# Patient Record
Sex: Male | Born: 1950 | Race: White | Hispanic: No | Smoking: Never smoker
Health system: Southern US, Community
[De-identification: ages and names within clinical notes are randomized; demographics above are authoritative.]

## PROBLEM LIST (undated history)

## (undated) DIAGNOSIS — I10 Essential (primary) hypertension: Secondary | ICD-10-CM

## (undated) HISTORY — DX: Essential (primary) hypertension: I10

## (undated) HISTORY — PX: CHOLECYSTECTOMY: SHX55

## (undated) HISTORY — PX: COLON SURGERY: SHX602

---

## 2014-11-13 ENCOUNTER — Telehealth: Payer: Self-pay | Admitting: *Deleted

## 2014-11-13 NOTE — Telephone Encounter (Signed)
Patient phoned needing to know Dr. Perfecto Kingdom walk in clinic schedule/appt - having strep throat sxs.  Requested Dr. Everlene Farrier specifically and stated that he was "Dr. Loreli Slot Waldschmidt's brother"....and he also brings his mother to see Dr. Everlene Farrier (did not state her name).....  Scheduled pt for a 15 min OV with Daub for 11/14/14 - tomorrow morning.  Address and contact information updated & confirmed.  No insurance - self pay.  Explained to patient that Dr. Everlene Farrier was not accepting new patients, that I may have to call him back & schedule him with someone else.  He verbalized understanding and appreciation.

## 2014-11-13 NOTE — Telephone Encounter (Signed)
This is the patient I was discussing with you.

## 2014-11-14 ENCOUNTER — Ambulatory Visit (INDEPENDENT_AMBULATORY_CARE_PROVIDER_SITE_OTHER): Payer: Self-pay | Admitting: Emergency Medicine

## 2014-11-14 ENCOUNTER — Encounter: Payer: Self-pay | Admitting: Emergency Medicine

## 2014-11-14 VITALS — BP 130/80 | HR 88 | Temp 98.1°F | Resp 18 | Ht 71.5 in | Wt 270.0 lb

## 2014-11-14 DIAGNOSIS — J029 Acute pharyngitis, unspecified: Secondary | ICD-10-CM

## 2014-11-14 LAB — POCT CBC
Granulocyte percent: 74 %G (ref 37–80)
HEMATOCRIT: 45.4 % (ref 43.5–53.7)
HEMOGLOBIN: 14.8 g/dL (ref 14.1–18.1)
Lymph, poc: 1.9 (ref 0.6–3.4)
MCH, POC: 30.5 pg (ref 27–31.2)
MCHC: 32.6 g/dL (ref 31.8–35.4)
MCV: 93.4 fL (ref 80–97)
MID (cbc): 0.3 (ref 0–0.9)
MPV: 7.1 fL (ref 0–99.8)
POC Granulocyte: 6.3 (ref 2–6.9)
POC LYMPH %: 22.5 % (ref 10–50)
POC MID %: 3.5 % (ref 0–12)
Platelet Count, POC: 195 10*3/uL (ref 142–424)
RBC: 4.85 M/uL (ref 4.69–6.13)
RDW, POC: 12.8 %
WBC: 8.5 10*3/uL (ref 4.6–10.2)

## 2014-11-14 LAB — POCT RAPID STREP A (OFFICE): RAPID STREP A SCREEN: NEGATIVE

## 2014-11-14 MED ORDER — FLUTICASONE PROPIONATE 50 MCG/ACT NA SUSP: 2.0000 | Freq: Every day | NASAL | Status: DC

## 2014-11-14 NOTE — Progress Notes (Addendum)
Subjective:  This chart was scribed for Arlyss Queen, MD by Leandra Kern, Medical Scribe. This patient was seen in Room 21 and the patient's care was started at 10:48 AM.   Patient ID: Brian Mcguire, male    DOB: 1950-07-11, 64 y.o.   MRN: [id number]  HPI HPI Comments: Brian Mcguire is a 64 y.o. male who presents to [facility] complaining of sore throat, gradual onset 5 days ago.  Pt reports associated symptoms of congestion, occasional productive cough of white color, trouble swallowing. He indicated the symptoms have improved since. Today, pt presents with a mild sore throat and cough. He is concerned that he might have a strep throat. Pt notes that he does have some dental issues, and he is concerned that they have complicated and might be related to today's symptoms. Pt does not have any previous allergies at this time of the year. Pt was never a regular smoker.   Pt notes that he is having trouble hearing with his left ear more than his right ear. He indicates that he took some over the counter medication, however he found no relief.   Pt notes that his mother is doing fine. She is comfortable, and is still under Hospice care.    Review of Systems  HENT: Positive for congestion, hearing loss (trouble hearing on Left ear vs. Right), sore throat and trouble swallowing.   Respiratory: Positive for cough.       Objective:   Physical Exam  Constitutional: He is oriented to person, place, and time. He appears well-developed and well-nourished. No distress.  HENT:  Head: Normocephalic and atraumatic.  Throat is slightly red.   Eyes: EOM are normal. Pupils are equal, round, and reactive to light.  Neck: Neck supple.  Cardiovascular: Normal rate and regular rhythm.  Exam reveals no gallop and no friction rub.   No murmur heard. Pulmonary/Chest: Effort normal and breath sounds normal. No respiratory distress. He has no wheezes. He has no rales.  Musculoskeletal:  Normal range of motion.  Neurological: He is alert and oriented to person, place, and time. No cranial nerve deficit.  Skin: Skin is warm and dry.  Psychiatric: He has a normal mood and affect. His behavior is normal.  Nursing note and vitals reviewed.  Results for orders placed or performed in visit on 11/14/14  POCT rapid strep A  Result Value Ref Range   Rapid Strep A Screen Negative Negative  POCT CBC  Result Value Ref Range   WBC 8.5 4.6 - 10.2 K/uL   Lymph, poc 1.9 0.6 - 3.4   POC LYMPH PERCENT 22.5 10 - 50 %L   MID (cbc) 0.3 0 - 0.9   POC MID % 3.5 0 - 12 %M   POC Granulocyte 6.3 2 - 6.9   Granulocyte percent 74.0 37 - 80 %G   RBC 4.85 4.69 - 6.13 M/uL   Hemoglobin 14.8 14.1 - 18.1 g/dL   HCT, POC 45.4 43.5 - 53.7 %   MCV 93.4 80 - 97 fL   MCH, POC 30.5 27 - 31.2 pg   MCHC 32.6 31.8 - 35.4 g/dL   RDW, POC 12.8 %   Platelet Count, POC 195 142 - 424 K/uL   MPV 7.1 0 - 99.8 fL       Assessment & Plan:  This appears to be a viral type illness. I advised him to do symptomatic care. He can do salt water gargles Tylenol  and try Flonase nasal spray.I personally performed the services described in this documentation, which was scribed in my presence. The recorded information has been reviewed and is accurate.  Nena Jordan, MD

## 2014-11-14 NOTE — Patient Instructions (Signed)

## 2014-11-14 NOTE — Telephone Encounter (Signed)
Discussed scheduling OV with Rodney Langton, BSN, who spoke with Dr. Everlene Farrier, and per Dr. Everlene Farrier, this OV is acceptable.

## 2014-11-15 LAB — CULTURE, GROUP A STREP: ORGANISM ID, BACTERIA: NORMAL

## 2015-11-22 DIAGNOSIS — H5213 Myopia, bilateral: Secondary | ICD-10-CM | POA: Diagnosis not present

## 2015-11-22 DIAGNOSIS — H2513 Age-related nuclear cataract, bilateral: Secondary | ICD-10-CM | POA: Diagnosis not present

## 2015-11-22 DIAGNOSIS — H52203 Unspecified astigmatism, bilateral: Secondary | ICD-10-CM | POA: Diagnosis not present

## 2015-11-26 DIAGNOSIS — D123 Benign neoplasm of transverse colon: Secondary | ICD-10-CM | POA: Diagnosis not present

## 2015-11-26 DIAGNOSIS — D124 Benign neoplasm of descending colon: Secondary | ICD-10-CM | POA: Diagnosis not present

## 2015-11-26 DIAGNOSIS — K635 Polyp of colon: Secondary | ICD-10-CM | POA: Diagnosis not present

## 2015-11-26 DIAGNOSIS — K5730 Diverticulosis of large intestine without perforation or abscess without bleeding: Secondary | ICD-10-CM | POA: Diagnosis not present

## 2015-11-26 DIAGNOSIS — Z86010 Personal history of colonic polyps: Secondary | ICD-10-CM | POA: Diagnosis not present

## 2015-11-27 DIAGNOSIS — D239 Other benign neoplasm of skin, unspecified: Secondary | ICD-10-CM | POA: Diagnosis not present

## 2015-11-27 DIAGNOSIS — L570 Actinic keratosis: Secondary | ICD-10-CM | POA: Diagnosis not present

## 2015-11-27 DIAGNOSIS — L821 Other seborrheic keratosis: Secondary | ICD-10-CM | POA: Diagnosis not present

## 2015-12-03 ENCOUNTER — Ambulatory Visit (INDEPENDENT_AMBULATORY_CARE_PROVIDER_SITE_OTHER): Payer: PPO | Admitting: Emergency Medicine

## 2015-12-03 ENCOUNTER — Telehealth: Payer: Self-pay

## 2015-12-03 ENCOUNTER — Ambulatory Visit (INDEPENDENT_AMBULATORY_CARE_PROVIDER_SITE_OTHER): Payer: PPO

## 2015-12-03 VITALS — BP 134/90 | HR 54 | Temp 98.6°F | Resp 17 | Ht 70.5 in | Wt 232.0 lb

## 2015-12-03 DIAGNOSIS — F1920 Other psychoactive substance dependence, uncomplicated: Secondary | ICD-10-CM | POA: Diagnosis not present

## 2015-12-03 DIAGNOSIS — H9313 Tinnitus, bilateral: Secondary | ICD-10-CM

## 2015-12-03 DIAGNOSIS — IMO0001 Reserved for inherently not codable concepts without codable children: Secondary | ICD-10-CM

## 2015-12-03 DIAGNOSIS — I10 Essential (primary) hypertension: Secondary | ICD-10-CM | POA: Diagnosis not present

## 2015-12-03 DIAGNOSIS — G8929 Other chronic pain: Secondary | ICD-10-CM | POA: Diagnosis not present

## 2015-12-03 DIAGNOSIS — Z1159 Encounter for screening for other viral diseases: Secondary | ICD-10-CM

## 2015-12-03 DIAGNOSIS — R030 Elevated blood-pressure reading, without diagnosis of hypertension: Secondary | ICD-10-CM

## 2015-12-03 DIAGNOSIS — Z0000 Encounter for general adult medical examination without abnormal findings: Secondary | ICD-10-CM | POA: Diagnosis not present

## 2015-12-03 DIAGNOSIS — R9431 Abnormal electrocardiogram [ECG] [EKG]: Secondary | ICD-10-CM

## 2015-12-03 DIAGNOSIS — Z125 Encounter for screening for malignant neoplasm of prostate: Secondary | ICD-10-CM

## 2015-12-03 DIAGNOSIS — M25562 Pain in left knee: Secondary | ICD-10-CM

## 2015-12-03 DIAGNOSIS — M25462 Effusion, left knee: Secondary | ICD-10-CM | POA: Diagnosis not present

## 2015-12-03 DIAGNOSIS — Z114 Encounter for screening for human immunodeficiency virus [HIV]: Secondary | ICD-10-CM | POA: Diagnosis not present

## 2015-12-03 DIAGNOSIS — Z23 Encounter for immunization: Secondary | ICD-10-CM | POA: Diagnosis not present

## 2015-12-03 LAB — LIPID PANEL
Cholesterol: 147 mg/dL (ref 125–200)
HDL: 47 mg/dL (ref >=40)
LDL CALC: 84 mg/dL (ref <130)
Total CHOL/HDL Ratio: 3.1 Ratio (ref <=5.0)
Triglycerides: 81 mg/dL (ref <150)
VLDL: 16 mg/dL (ref <30)

## 2015-12-03 LAB — POCT CBC
GRANULOCYTE PERCENT: 65 % (ref 37–80)
HCT, POC: 41.6 % — AB (ref 43.5–53.7)
Hemoglobin: 14.5 g/dL (ref 14.1–18.1)
Lymph, poc: 1.8 (ref 0.6–3.4)
MCH: 33.1 pg — AB (ref 27–31.2)
MCHC: 34.9 g/dL (ref 31.8–35.4)
MCV: 94.8 fL (ref 80–97)
MID (cbc): 0.7 (ref 0–0.9)
MPV: 7 fL (ref 0–99.8)
POC Granulocyte: 4.5 (ref 2–6.9)
POC LYMPH PERCENT: 25.4 %L (ref 10–50)
POC MID %: 9.6 % (ref 0–12)
Platelet Count, POC: 170 10*3/uL (ref 142–424)
RBC: 4.39 M/uL — AB (ref 4.69–6.13)
RDW, POC: 13 %
WBC: 6.9 10*3/uL (ref 4.6–10.2)

## 2015-12-03 LAB — POCT URINALYSIS DIP (MANUAL ENTRY)
BILIRUBIN UA: NEGATIVE
Glucose, UA: NEGATIVE
Leukocytes, UA: NEGATIVE
Nitrite, UA: NEGATIVE
PH UA: 6
Protein Ur, POC: 30 — AB
RBC UA: NEGATIVE
SPEC GRAV UA: 1.02
Urobilinogen, UA: 1

## 2015-12-03 LAB — COMPLETE METABOLIC PANEL WITH GFR
ALBUMIN: 4.7 g/dL (ref 3.6–5.1)
ALK PHOS: 59 U/L (ref 40–115)
ALT: 15 U/L (ref 9–46)
AST: 17 U/L (ref 10–35)
BUN: 13 mg/dL (ref 7–25)
CO2: 29 mmol/L (ref 20–31)
CREATININE: 1.1 mg/dL (ref 0.70–1.25)
Calcium: 9.5 mg/dL (ref 8.6–10.3)
Chloride: 98 mmol/L (ref 98–110)
GFR, EST NON AFRICAN AMERICAN: 70 mL/min (ref >=60)
GFR, Est African American: 81 mL/min (ref >=60)
Glucose, Bld: 93 mg/dL (ref 65–99)
Potassium: 4.3 mmol/L (ref 3.5–5.3)
SODIUM: 137 mmol/L (ref 135–146)
TOTAL PROTEIN: 6.9 g/dL (ref 6.1–8.1)
Total Bilirubin: 1.2 mg/dL (ref 0.2–1.2)

## 2015-12-03 LAB — HEPATITIS C ANTIBODY: HCV Ab: NEGATIVE

## 2015-12-03 LAB — HIV ANTIBODY (ROUTINE TESTING W REFLEX): HIV 1&2 Ab, 4th Generation: NONREACTIVE

## 2015-12-03 LAB — POC MICROSCOPIC URINALYSIS (UMFC): Mucus: ABSENT

## 2015-12-03 MED ORDER — ZOSTER VACCINE LIVE 19400 UNT/0.65ML ~~LOC~~ SUSR: 0.6500 mL | Freq: Once | SUBCUTANEOUS | 0 refills | Status: AC

## 2015-12-03 NOTE — Patient Instructions (Addendum)
IF you received an x-ray today, you will receive an invoice from [facility]. Please contact [facility] at [phone] with questions or concerns regarding your invoice.   IF you received labwork today, you will receive an invoice from [employer]. Please contact [facility] at [phone] with questions or concerns regarding your invoice.   Our billing staff will not be able to assist you with questions regarding bills from these companies.  You will be contacted with the lab results as soon as they are available. The fastest way to get your results is to activate your My Chart account. Instructions are located on the last page of this paperwork. If you have not heard from [place] regarding the results in 2 weeks, please contact this office.     Routine gHealth Maintenance, Male A healthy lifestyle and preventative care can promote health and wellness.  Maintain regular health, dental, and eye exams.  Eat a healthy diet. Foods like vegetables, fruits, whole grains, low-fat dairy products, and lean protein foods contain the nutrients you need and are low in calories. Decrease your intake of foods high in solid fats, added sugars, and salt. Get information about a proper diet from your health care provider, if necessary.  Regular physical exercise is one of the most important things you can do for your health. Most adults should get at least 150 minutes of moderate-intensity exercise (any activity that increases your heart rate and causes you to sweat) each week. In addition, most adults need muscle-strengthening exercises on 2 or more days a week.   Maintain a healthy weight. The body mass index (BMI) is a screening tool to identify possible weight problems. It provides an estimate of body fat based on height and weight. Your health care provider can find your BMI and can help you achieve or maintain a healthy weight. For males 20 years and  older:  A BMI below 18.5 is considered underweight.  A BMI of 18.5 to 24.9 is normal.  A BMI of 25 to 29.9 is considered overweight.  A BMI of 30 and above is considered obese.  Maintain normal blood lipids and cholesterol by exercising and minimizing your intake of saturated fat. Eat a balanced diet with plenty of fruits and vegetables. Blood tests for lipids and cholesterol should begin at age 96 and be repeated every 5 years. If your lipid or cholesterol levels are high, you are over age 39, or you are at high risk for heart disease, you may need your cholesterol levels checked more frequently.Ongoing high lipid and cholesterol levels should be treated with medicines if diet and exercise are not working.  If you smoke, find out from your health care provider how to quit. If you do not use tobacco, do not start.  Lung cancer screening is recommended for adults aged 42-80 years who are at high risk for developing lung cancer because of a history of smoking. A yearly low-dose CT scan of the lungs is recommended for people who have at least a 30-pack-year history of smoking and are current smokers or have quit within the past 15 years. A pack year of smoking is smoking an average of 1 pack of cigarettes a day for 1 year (for example, a 30-pack-year history of smoking could mean smoking 1 pack a day for 30 years or 2 packs a day for 15 years). Yearly screening should continue until the smoker has stopped smoking for at least 15 years. Yearly screening should  be stopped for people who develop a health problem that would prevent them from having lung cancer treatment.  If you choose to drink alcohol, do not have more than 2 drinks per day. One drink is considered to be 12 oz (360 mL) of beer, 5 oz (150 mL) of wine, or 1.5 oz (45 mL) of liquor.  Avoid the use of street drugs. Do not share needles with anyone. Ask for help if you need support or instructions about stopping the use of drugs.  High  blood pressure causes heart disease and increases the risk of stroke. High blood pressure is more likely to develop in:  People who have blood pressure in the end of the normal range (100-139/85-89 mm Hg).  People who are overweight or obese.  People who are African American.  If you are 60-77 years of age, have your blood pressure checked every 3-5 years. If you are 15 years of age or older, have your blood pressure checked every year. You should have your blood pressure measured twice--once when you are at a hospital or clinic, and once when you are not at a hospital or clinic. Record the average of the two measurements. To check your blood pressure when you are not at a hospital or clinic, you can use:  An automated blood pressure machine at a pharmacy.  A home blood pressure monitor.  If you are 33-44 years old, ask your health care provider if you should take aspirin to prevent heart disease.  Diabetes screening involves taking a blood sample to check your fasting blood sugar level. This should be done once every 3 years after age 83 if you are at a normal weight and without risk factors for diabetes. Testing should be considered at a younger age or be carried out more frequently if you are overweight and have at least 1 risk factor for diabetes.  Colorectal cancer can be detected and often prevented. Most routine colorectal cancer screening begins at the age of 110 and continues through age 73. However, your health care provider may recommend screening at an earlier age if you have risk factors for colon cancer. On a yearly basis, your health care provider may provide home test kits to check for hidden blood in the stool. A small camera at the end of a tube may be used to directly examine the colon (sigmoidoscopy or colonoscopy) to detect the earliest forms of colorectal cancer. Talk to your health care provider about this at age 16 when routine screening begins. A direct exam of the colon  should be repeated every 5-10 years through age 33, unless early forms of precancerous polyps or small growths are found.  People who are at an increased risk for hepatitis B should be screened for this virus. You are considered at high risk for hepatitis B if:  You were born in a country where hepatitis B occurs often. Talk with your health care provider about which countries are considered high risk.  Your parents were born in a high-risk country and you have not received a shot to protect against hepatitis B (hepatitis B vaccine).  You have HIV or AIDS.  You use needles to inject street drugs.  You live with, or have sex with, someone who has hepatitis B.  You are a man who has sex with other men (MSM).  You get hemodialysis treatment.  You take certain medicines for conditions like cancer, organ transplantation, and autoimmune conditions.  Hepatitis C blood testing is  recommended for all people born from [birth year] through 1965 and any individual with known risk factors for hepatitis C.  Healthy men should no longer receive prostate-specific antigen (PSA) blood tests as part of routine cancer screening. Talk to your health care provider about prostate cancer screening.  Testicular cancer screening is not recommended for adolescents or adult males who have no symptoms. Screening includes self-exam, a health care provider exam, and other screening tests. Consult with your health care provider about any symptoms you have or any concerns you have about testicular cancer.  Practice safe sex. Use condoms and avoid high-risk sexual practices to reduce the spread of sexually transmitted infections (STIs).  You should be screened for STIs, including gonorrhea and chlamydia if:  You are sexually active and are younger than 24 years.  You are older than 24 years, and your health care provider tells you that you are at risk for this type of infection.  Your sexual activity has changed since you  were last screened, and you are at an increased risk for chlamydia or gonorrhea. Ask your health care provider if you are at risk.  If you are at risk of being infected with HIV, it is recommended that you take a prescription medicine daily to prevent HIV infection. This is called pre-exposure prophylaxis (PrEP). You are considered at risk if:  You are a man who has sex with other men (MSM).  You are a heterosexual man who is sexually active with multiple partners.  You take drugs by injection.  You are sexually active with a partner who has HIV.  Talk with your health care provider about whether you are at high risk of being infected with HIV. If you choose to begin PrEP, you should first be tested for HIV. You should then be tested every 3 months for as long as you are taking PrEP.  Use sunscreen. Apply sunscreen liberally and repeatedly throughout the day. You should seek shade when your shadow is shorter than you. Protect yourself by wearing long sleeves, pants, a wide-brimmed hat, and sunglasses year round whenever you are outdoors.  Tell your health care provider of new moles or changes in moles, especially if there is a change in shape or color. Also, tell your health care provider if a mole is larger than the size of a pencil eraser.  A one-time screening for abdominal aortic aneurysm (AAA) and surgical repair of large AAAs by ultrasound is recommended for men aged 31-75 years who are current or former smokers.  Stay current with your vaccines (immunizations).   This information is not intended to replace advice given to you by your health care provider. Make sure you discuss any questions you have with your health care provider.   Document Released: 10/18/2007 Document Revised: 05/12/2014 Document Reviewed: 09/16/2010 Elsevier Interactive Patient Education [employer].
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
## 2015-12-03 NOTE — Telephone Encounter (Signed)
Pt states he requested Medical records: Xray disc- knee and chest but only received imaging for the Chest Xray. If possible, please make a copy W/view of knee, as he would like to come by this evening to pick up his results before the close of business.   **this msg will be marked as urgent due to item being req. @ the time of service

## 2015-12-04 ENCOUNTER — Telehealth: Payer: Self-pay | Admitting: Emergency Medicine

## 2015-12-04 LAB — PSA, MEDICARE: PSA: 0.28 ng/mL (ref <=4.00)

## 2015-12-04 NOTE — Telephone Encounter (Signed)
Please call patient and let him know his labs were excellent.

## 2015-12-04 NOTE — Telephone Encounter (Signed)
Sent to xray.

## 2015-12-04 NOTE — Telephone Encounter (Signed)
Pt given lab results Pt wanted to know if he should see an Audiologist for tinnitus that he discussed during yesterdays visit. Please f/u

## 2015-12-05 ENCOUNTER — Other Ambulatory Visit: Payer: Self-pay | Admitting: Emergency Medicine

## 2015-12-05 DIAGNOSIS — H9313 Tinnitus, bilateral: Secondary | ICD-10-CM

## 2015-12-05 NOTE — Telephone Encounter (Signed)
Call patient and let him know I made referral to ENT.

## 2015-12-05 NOTE — Telephone Encounter (Signed)
Left VM informing pt of referral and if he has any questions to call back.

## 2015-12-13 DIAGNOSIS — H9313 Tinnitus, bilateral: Secondary | ICD-10-CM | POA: Diagnosis not present

## 2015-12-13 DIAGNOSIS — H903 Sensorineural hearing loss, bilateral: Secondary | ICD-10-CM | POA: Diagnosis not present

## 2015-12-13 DIAGNOSIS — H6121 Impacted cerumen, right ear: Secondary | ICD-10-CM | POA: Diagnosis not present

## 2016-08-02 ENCOUNTER — Ambulatory Visit (INDEPENDENT_AMBULATORY_CARE_PROVIDER_SITE_OTHER): Payer: PPO

## 2016-08-02 ENCOUNTER — Ambulatory Visit (INDEPENDENT_AMBULATORY_CARE_PROVIDER_SITE_OTHER): Payer: PPO | Admitting: Physician Assistant

## 2016-08-02 ENCOUNTER — Telehealth: Payer: Self-pay

## 2016-08-02 VITALS — BP 148/90 | HR 76 | Temp 97.5°F | Resp 16 | Ht 71.0 in | Wt 238.1 lb

## 2016-08-02 DIAGNOSIS — R053 Chronic cough: Secondary | ICD-10-CM

## 2016-08-02 DIAGNOSIS — R030 Elevated blood-pressure reading, without diagnosis of hypertension: Secondary | ICD-10-CM | POA: Diagnosis not present

## 2016-08-02 DIAGNOSIS — R05 Cough: Secondary | ICD-10-CM | POA: Diagnosis not present

## 2016-08-02 LAB — POCT CBC
Granulocyte percent: 68.6 %G (ref 37–80)
HCT, POC: 41.8 % — AB (ref 43.5–53.7)
HEMOGLOBIN: 14.9 g/dL (ref 14.1–18.1)
LYMPH, POC: 1.5 (ref 0.6–3.4)
MCH, POC: 34.4 pg — AB (ref 27–31.2)
MCHC: 35.6 g/dL — AB (ref 31.8–35.4)
MCV: 96.8 fL (ref 80–97)
MID (cbc): 0.5 (ref 0–0.9)
MPV: 7.5 fL (ref 0–99.8)
POC Granulocyte: 4.5 (ref 2–6.9)
POC LYMPH PERCENT: 23.8 %L (ref 10–50)
POC MID %: 7.6 % (ref 0–12)
Platelet Count, POC: 146 10*3/uL (ref 142–424)
RBC: 4.32 M/uL — AB (ref 4.69–6.13)
RDW, POC: 13.8 %
WBC: 6.5 10*3/uL (ref 4.6–10.2)

## 2016-08-02 MED ORDER — AZITHROMYCIN 250 MG PO TABS: ORAL_TABLET | ORAL | 0 refills | Status: DC

## 2016-08-02 NOTE — Telephone Encounter (Signed)
He needs an OV. Please schedule. Philis Fendt, MS, PA-C 1:18 PM, 08/02/2016

## 2016-08-02 NOTE — Telephone Encounter (Signed)
Pt forgot to ask you about getting an rx for viagra today, can you rx this or does he need an ov?

## 2016-08-02 NOTE — Patient Instructions (Signed)
     IF you received an x-ray today, you will receive an invoice from [facility]. Please contact [facility] at [phone] with questions or concerns regarding your invoice.   IF you received labwork today, you will receive an invoice from [facility]. Please contact [facility] at [phone] with questions or concerns regarding your invoice.   Our billing staff will not be able to assist you with questions regarding bills from these companies.  You will be contacted with the lab results as soon as they are available. The fastest way to get your results is to activate your My Chart account. Instructions are located on the last page of this paperwork. If you have not heard from us regarding the results in 2 weeks, please contact this office.     

## 2016-08-02 NOTE — Progress Notes (Addendum)
08/02/2016 10:45 AM   DOB: [birth year] / MRN: [id number]  SUBJECTIVE:  Brian Mcguire is a 66 y.o. male presenting for that cough three weeks ago.  Thought this was the flu. The cough was fading after about 2 weeks then came back and was worse.  No history of asthma.  Never smoker.Associates nasal congestion early in the course of the illness however this has largely resolved.    He has No Known Allergies.   He  has no past medical history on file.    He  reports that he has never smoked. He has never used smokeless tobacco. He reports that he does not drink alcohol or use drugs. He  reports that he does not engage in sexual activity. The patient  has a past surgical history that includes Colon surgery and Cholecystectomy.  His family history includes Cancer in his father; Heart disease in his father and maternal grandfather; Hypertension in his father.  Review of Systems  Constitutional: Negative for diaphoresis.  Respiratory: Negative for cough, hemoptysis, sputum production, shortness of breath and wheezing.   Cardiovascular: Negative for chest pain, orthopnea and leg swelling.  Gastrointestinal: Negative for nausea.  Neurological: Negative for dizziness.    The problem list and medications were reviewed and updated by myself where necessary and exist elsewhere in the encounter.   OBJECTIVE:  BP (!) 148/90 (BP Location: Right Arm, Patient Position: Sitting, Cuff Size: Large)   Pulse 76   Temp 97.5 F (36.4 C) (Oral)   Resp 16   Ht 5\' 11"  (1.803 m)   Wt 238 lb 2 oz (108 kg)   SpO2 99%   BMI 33.21 kg/m   BP Readings from Last 3 Encounters:  08/02/16 (!) 148/90  12/03/15 134/90  11/14/14 130/80    Physical Exam  Constitutional: He is oriented to person, place, and time. He appears well-developed and well-nourished. No distress.  Cardiovascular: Normal rate and regular rhythm.   Pulmonary/Chest: Effort normal and breath sounds normal. No stridor. No respiratory distress.  He has no wheezes. He has no rales.  Abdominal: Soft.  Neurological: He is alert and oriented to person, place, and time.  Skin: Skin is warm and dry. He is not diaphoretic.    Results for orders placed or performed in visit on 08/02/16 (from the past 72 hour(s))  POCT CBC     Status: Abnormal   Collection Time: 08/02/16 10:30 AM  Result Value Ref Range   WBC 6.5 4.6 - 10.2 K/uL   Lymph, poc 1.5 0.6 - 3.4   POC LYMPH PERCENT 23.8 10 - 50 %L   MID (cbc) 0.5 0 - 0.9   POC MID % 7.6 0 - 12 %M   POC Granulocyte 4.5 2 - 6.9   Granulocyte percent 68.6 37 - 80 %G   RBC 4.32 (A) 4.69 - 6.13 M/uL   Hemoglobin 14.9 14.1 - 18.1 g/dL   HCT, POC 41.8 (A) 43.5 - 53.7 %   MCV 96.8 80 - 97 fL   MCH, POC 34.4 (A) 27 - 31.2 pg   MCHC 35.6 (A) 31.8 - 35.4 g/dL   RDW, POC 13.8 %   Platelet Count, POC 146 142 - 424 K/uL   MPV 7.5 0 - 99.8 fL    Dg Chest 2 View  Result Date: 08/02/2016 CLINICAL DATA:  Cough for 3 weeks EXAM: CHEST  2 VIEW COMPARISON:  December 03, 2015 FINDINGS: No pneumothorax. The heart size borderline but stable. The hila and  mediastinum are normal. Minimal scar or chronic atelectasis in the left base. No other acute abnormalities. No focal infiltrate. IMPRESSION: No active cardiopulmonary disease. Electronically Signed   By: Dorise Bullion III M.D   On: 08/02/2016 10:38    ASSESSMENT AND PLAN:  Brian Mcguire was seen today for cough, wheezing and nasal congestion.  Diagnoses and all orders for this visit:  Persistent cough for 3 weeks or longer: Likely post viral cough.  Will give him more time.  He can try zpac in about 7 days if not improving. Of note BP up today higher than usual for him.  He will monitor and come back if pressures remain greater than 140/90.   -     DG Chest 2 View; Future -     POCT CBC    The patient is advised to call or return to clinic if he does not see an improvement in symptoms, or to seek the care of the closest emergency department if he worsens with  the above plan.   Philis Fendt, MHS, PA-C [facility] 08/02/2016 10:45 AM

## 2016-08-06 ENCOUNTER — Ambulatory Visit (INDEPENDENT_AMBULATORY_CARE_PROVIDER_SITE_OTHER): Payer: PPO | Admitting: Physician Assistant

## 2016-08-06 VITALS — BP 183/106 | HR 76 | Temp 98.7°F | Resp 18 | Ht 71.0 in | Wt 241.2 lb

## 2016-08-06 DIAGNOSIS — I10 Essential (primary) hypertension: Secondary | ICD-10-CM | POA: Diagnosis not present

## 2016-08-06 DIAGNOSIS — N529 Male erectile dysfunction, unspecified: Secondary | ICD-10-CM

## 2016-08-06 MED ORDER — SILDENAFIL CITRATE 100 MG PO TABS: ORAL_TABLET | ORAL | 11 refills | Status: DC

## 2016-08-06 MED ORDER — SILDENAFIL CITRATE 100 MG PO TABS: ORAL_TABLET | ORAL | 3 refills | Status: DC

## 2016-08-06 MED ORDER — LISINOPRIL-HYDROCHLOROTHIAZIDE 10-12.5 MG PO TABS: 1.0000 | ORAL_TABLET | Freq: Every day | ORAL | 1 refills | Status: DC

## 2016-08-06 NOTE — Progress Notes (Signed)
08/06/2016 4:08 PM   DOB: 1951/03/06 / MRN: [id number]  SUBJECTIVE:  Brian Mcguire is a 66 y.o. male presenting for Viagra.  He has taken this before from a friend and he tells me it helped him get an erection faster, but more importantly it helped him maintain an erection. He took 25 mg.  Tells me he struggles with maintaining an erection about 50% of the time and often has to cease intercourse to "try and rally later."  He has no known heart problems and does not take nitrates.    He has No Known Allergies.   He  has no past medical history on file.    He  reports that he has never smoked. He has never used smokeless tobacco. He reports that he does not drink alcohol or use drugs. He  reports that he does not engage in sexual activity. The patient  has a past surgical history that includes Colon surgery and Cholecystectomy.  His family history includes Cancer in his father; Heart disease in his father and maternal grandfather; Hypertension in his father.  Review of Systems  Constitutional: Negative for diaphoresis.  Respiratory: Negative for cough, hemoptysis, sputum production, shortness of breath and wheezing.   Cardiovascular: Negative for chest pain, orthopnea and leg swelling.  Gastrointestinal: Negative for abdominal pain, blood in stool, constipation, diarrhea, heartburn, melena, nausea and vomiting.  Genitourinary: Negative for flank pain.  Neurological: Negative for dizziness.    The problem list and medications were reviewed and updated by myself where necessary and exist elsewhere in the encounter.   OBJECTIVE:  BP (!) 183/106 (BP Location: Left Arm, Patient Position: Sitting, Cuff Size: Large)   Pulse 76   Temp 98.7 F (37.1 C) (Oral)   Resp 18   Ht 5\' 11"  (1.803 m)   Wt 241 lb 3.2 oz (109.4 kg)   SpO2 96%   BMI 33.64 kg/m   Physical Exam  Constitutional: He is oriented to person, place, and time.  Eyes: EOM are normal. Pupils are equal, round, and reactive to  light.  Cardiovascular: Regular rhythm, S1 normal, S2 normal, normal heart sounds, intact distal pulses and normal pulses.  Exam reveals no gallop and no friction rub.   No murmur heard. Pulmonary/Chest: Effort normal. No stridor. No respiratory distress. He has no wheezes. He has no rales.  Abdominal: He exhibits no distension.  Musculoskeletal: He exhibits no edema.  Neurological: He is alert and oriented to person, place, and time. He has normal strength. He is not disoriented. No cranial nerve deficit or sensory deficit. He exhibits normal muscle tone. Coordination and gait normal.  Psychiatric: His behavior is normal.    No results found for: TSH  BP Readings from Last 3 Encounters:  08/06/16 (!) 183/106  08/02/16 (!) 148/90  12/03/15 134/90   No results found for this or any previous visit (from the past 72 hour(s)).  No results found.  ASSESSMENT AND PLAN:  Billyjoe was seen today for medication refill.  Diagnoses and all orders for this visit:  Erectile dysfunction, unspecified erectile dysfunction type:  He has persistent difficulty with maintaining an erection. Given problem two and that he is starting a BP agent I have advised he hold this for about 5 days before starting.   -     sildenafil (VIAGRA) 100 MG tablet; Take 1/4 tab daily as needed.  Asymptomatic hypertension: He is asymptomatic today.  Will go ahead and start him on a dual agent and he will  check his BP at home.  If persistently greater that 140/90 he will return to clinic, otherwise I will see him back in three months.  -     lisinopril-hydrochlorothiazide (PRINZIDE,ZESTORETIC) 10-12.5 MG tablet; Take 1 tablet by mouth daily. -     Basic metabolic panel -     TSH     The patient is advised to call or return to clinic if he does not see an improvement in symptoms, or to seek the care of the closest emergency department if he worsens with the above plan.   Philis Fendt, MHS, PA-C [facility] 08/06/2016 4:08 PM

## 2016-08-06 NOTE — Patient Instructions (Addendum)
Please check blood pressure.  If blood pressure is 140/90 or greater please return  To our office in two weeks.     IF you received an x-ray today, you will receive an invoice from [facility]. Please contact [facility] at [phone] with questions or concerns regarding your invoice.   IF you received labwork today, you will receive an invoice from [facility]. Please contact [facility] at [phone] with questions or concerns regarding your invoice.   Our billing staff will not be able to assist you with questions regarding bills from these companies.  You will be contacted with the lab results as soon as they are available. The fastest way to get your results is to activate your My Chart account. Instructions are located on the last page of this paperwork. If you have not heard from [place] regarding the results in 2 weeks, please contact this office.

## 2016-08-07 LAB — BASIC METABOLIC PANEL
BUN / CREAT RATIO: 15 (ref 10–24)
BUN: 16 mg/dL (ref 8–27)
CHLORIDE: 96 mmol/L (ref 96–106)
CO2: 26 mmol/L (ref 18–29)
Calcium: 9.1 mg/dL (ref 8.6–10.2)
Creatinine, Ser: 1.06 mg/dL (ref 0.76–1.27)
GFR calc non Af Amer: 73 mL/min/{1.73_m2} (ref >59)
GFR, EST AFRICAN AMERICAN: 85 mL/min/{1.73_m2} (ref >59)
Glucose: 91 mg/dL (ref 65–99)
POTASSIUM: 4.3 mmol/L (ref 3.5–5.2)
Sodium: 138 mmol/L (ref 134–144)

## 2016-11-24 DIAGNOSIS — H2513 Age-related nuclear cataract, bilateral: Secondary | ICD-10-CM | POA: Diagnosis not present

## 2016-12-29 DIAGNOSIS — L570 Actinic keratosis: Secondary | ICD-10-CM | POA: Diagnosis not present

## 2016-12-29 DIAGNOSIS — D229 Melanocytic nevi, unspecified: Secondary | ICD-10-CM | POA: Diagnosis not present

## 2016-12-29 DIAGNOSIS — L820 Inflamed seborrheic keratosis: Secondary | ICD-10-CM | POA: Diagnosis not present

## 2016-12-29 DIAGNOSIS — L720 Epidermal cyst: Secondary | ICD-10-CM | POA: Diagnosis not present

## 2016-12-29 DIAGNOSIS — D492 Neoplasm of unspecified behavior of bone, soft tissue, and skin: Secondary | ICD-10-CM | POA: Diagnosis not present

## 2017-01-26 ENCOUNTER — Other Ambulatory Visit: Payer: Self-pay | Admitting: Physician Assistant

## 2017-01-26 DIAGNOSIS — I10 Essential (primary) hypertension: Secondary | ICD-10-CM

## 2017-03-04 ENCOUNTER — Other Ambulatory Visit: Payer: Self-pay | Admitting: Physician Assistant

## 2017-03-04 DIAGNOSIS — I10 Essential (primary) hypertension: Secondary | ICD-10-CM

## 2017-03-05 ENCOUNTER — Other Ambulatory Visit: Payer: Self-pay | Admitting: Physician Assistant

## 2017-03-05 DIAGNOSIS — I10 Essential (primary) hypertension: Secondary | ICD-10-CM

## 2017-03-05 NOTE — Telephone Encounter (Signed)
Pt. Called looking for a refill of lisinopril for his high blood pressure. He states that he is out of town and is wanting a refill for about 10 days so he can get back in to town and make an appointment to come in and see Clark.   When I asked what pharmacy to send the RX to, pt. States that we would send it to the normal pharmacy and he would have his brother pick it up and send it to him.

## 2017-03-06 MED ORDER — LISINOPRIL-HYDROCHLOROTHIAZIDE 10-12.5 MG PO TABS: 1.0000 | ORAL_TABLET | Freq: Every day | ORAL | 0 refills | Status: DC

## 2017-03-06 NOTE — Telephone Encounter (Signed)
Per chart, patient and pharmacy made aware 9/ 2018 that an office visit is needed before refills would be given. Patient has not scheduled any appointment with office. 15 days only sent in to pharmacy until patient can be seen. No further refills will be given./ S.Kattia Selley,CMA

## 2017-03-23 ENCOUNTER — Ambulatory Visit (INDEPENDENT_AMBULATORY_CARE_PROVIDER_SITE_OTHER): Payer: PPO

## 2017-03-23 ENCOUNTER — Ambulatory Visit: Payer: PPO | Admitting: Family Medicine

## 2017-03-23 VITALS — BP 128/80 | HR 73 | Temp 97.4°F | Ht 71.0 in | Wt 245.5 lb

## 2017-03-23 DIAGNOSIS — Z0000 Encounter for general adult medical examination without abnormal findings: Secondary | ICD-10-CM | POA: Diagnosis not present

## 2017-03-23 NOTE — Progress Notes (Signed)
Subjective:   Brian Mcguire is a 66 y.o. male who presents for Medicare Annual/Subsequent preventive examination.  Review of Systems:  N/A Cardiac Risk Factors include: male gender;sedentary lifestyle;advanced age (>53men, >42 women)     Objective:    Vitals: BP 128/80   Pulse 73   Temp (!) 97.4 F (36.3 C) (Oral)   Ht 5\' 11"  (1.803 m)   Wt 245 lb 8 oz (111.4 kg)   SpO2 98%   BMI 34.24 kg/m   Body mass index is 34.24 kg/m.  Tobacco Social History   Tobacco Use  Smoking Status Never Smoker  Smokeless Tobacco Never Used     Counseling given: Not Answered   History reviewed. No pertinent past medical history. Past Surgical History:  Procedure Laterality Date  . CHOLECYSTECTOMY    . COLON SURGERY     Family History  Problem Relation Age of Onset  . Cancer Father   . Hypertension Father   . Heart disease Father   . Heart disease Maternal Grandfather    Social History   Substance and Sexual Activity  Sexual Activity No    Outpatient Encounter Medications as of 03/23/2017  Medication Sig  . lisinopril-hydrochlorothiazide (PRINZIDE,ZESTORETIC) 10-12.5 MG tablet Take 1 tablet by mouth daily. **NO FURTHER REFILLS WILL BE GIVEN**  . [DISCONTINUED] sildenafil (VIAGRA) 100 MG tablet Take 1/4 tab daily as needed.   No facility-administered encounter medications on file as of 03/23/2017.     Activities of Daily Living In your present state of health, do you have any difficulty performing the following activities: 03/23/2017  Hearing? Y  Comment Patient has tinnitus  Vision? N  Difficulty concentrating or making decisions? N  Walking or climbing stairs? N  Dressing or bathing? N  Doing errands, shopping? N  Preparing Food and eating ? N  Using the Toilet? N  In the past six months, have you accidently leaked urine? N  Do you have problems with loss of bowel control? N  Managing your Medications? N  Managing your Finances? N  Housekeeping or managing your  Housekeeping? N  Some recent data might be hidden    Patient Care Team: Hillis Range as PCP - General (Urgent Care) Rutherford Guys, MD as Consulting Physician (Ophthalmology) Lavonna Monarch, MD as Consulting Physician (Dermatology) Clarene Essex, MD as Consulting Physician (Gastroenterology)   Assessment:     Exercise Activities and Dietary recommendations Current Exercise Habits: The patient does not participate in regular exercise at present, Exercise limited by: None identified  Goals    . Exercise 3x per week (30 min per time)     Patient states that he wants to try to start back exercising on a more regular basis.       Fall Risk Fall Risk  03/23/2017 08/06/2016 08/02/2016 11/14/2014  Falls in the past year? No No No No   Depression Screen PHQ 2/9 Scores 03/23/2017 08/06/2016 08/02/2016 11/14/2014  PHQ - 2 Score 0 0 0 0    Cognitive Function     6CIT Screen 03/23/2017  What Year? 0 points  What month? 0 points  What time? 0 points  Count back from 20 0 points  Months in reverse 0 points  Repeat phrase 2 points  Total Score 2    Immunization History  Administered Date(s) Administered  . Pneumococcal Conjugate-13 12/03/2015   Screening Tests Health Maintenance  Topic Date Due  . INFLUENZA VACCINE  08/16/2017 (Originally 12/03/2016)  . TETANUS/TDAP  03/23/2018 (Originally  08/19/1969)  . PNA vac Low Risk Adult (2 of 2 - PPSV23) 03/23/2018 (Originally 12/02/2016)  . COLONOSCOPY  11/25/2025  . Hepatitis C Screening  Completed      Plan:   I have personally reviewed and noted the following in the patient's chart:   . Medical and social history . Use of alcohol, tobacco or illicit drugs  . Current medications and supplements . Functional ability and status . Nutritional status . Physical activity . Advanced directives . List of other physicians . Hospitalizations, surgeries, and ER visits in previous 12 months . Vitals . Screenings to include cognitive,  depression, and falls . Referrals and appointments  In addition, I have reviewed and discussed with patient certain preventive protocols, quality metrics, and best practice recommendations. A written personalized care plan for preventive services as well as general preventive health recommendations were provided to patient.  Patient declined flu, pneumonia, tetanus and shingle vaccines.  1. Encounter for Medicare annual wellness exam    Andrez Grime, LPN  16/02/9603

## 2017-03-23 NOTE — Patient Instructions (Addendum)
Brian Mcguire , Thank you for taking time to come for your Medicare Wellness Visit. I appreciate your ongoing commitment to your health goals. Please review the following plan we discussed and let me know if I can assist you in the future.   Screening recommendations/referrals: Colonoscopy: up to date, next due 11/25/2025 Recommended yearly ophthalmology/optometry visit for glaucoma screening and checkup Recommended yearly dental visit for hygiene and checkup  Vaccinations: Influenza vaccine: declined  Pneumococcal vaccine: declined Tdap vaccine: declined due to insurance Shingles vaccine: declined  Advanced directives: Advance directive discussed with you today. I have provided a copy for you to complete at home and have notarized. Once this is complete please bring a copy in to our office so we can scan it into your chart.   Conditions/risks identified: Try to start back exercising on a more regular basis.   Next appointment: today @ 2:40 pm with Dr. Nolon Rod   Preventive Care 66 Years and Older, Male Preventive care refers to lifestyle choices and visits with your health care provider that can promote health and wellness. What does preventive care include?  A yearly physical exam. This is also called an annual well check.  Dental exams once or twice a year.  Routine eye exams. Ask your health care provider how often you should have your eyes checked.  Personal lifestyle choices, including:  Daily care of your teeth and gums.  Regular physical activity.  Eating a healthy diet.  Avoiding tobacco and drug use.  Limiting alcohol use.  Practicing safe sex.  Taking low doses of aspirin every day.  Taking vitamin and mineral supplements as recommended by your health care provider. What happens during an annual well check? The services and screenings done by your health care provider during your annual well check will depend on your age, overall health, lifestyle risk factors,  and family history of disease. Counseling  Your health care provider may ask you questions about your:  Alcohol use.  Tobacco use.  Drug use.  Emotional well-being.  Home and relationship well-being.  Sexual activity.  Eating habits.  History of falls.  Memory and ability to understand (cognition).  Work and work Statistician. Screening  You may have the following tests or measurements:  Height, weight, and BMI.  Blood pressure.  Lipid and cholesterol levels. These may be checked every 5 years, or more frequently if you are over 38 years old.  Skin check.  Lung cancer screening. You may have this screening every year starting at age 2 if you have a 30-pack-year history of smoking and currently smoke or have quit within the past 15 years.  Fecal occult blood test (FOBT) of the stool. You may have this test every year starting at age 44.  Flexible sigmoidoscopy or colonoscopy. You may have a sigmoidoscopy every 5 years or a colonoscopy every 10 years starting at age 51.  Prostate cancer screening. Recommendations will vary depending on your family history and other risks.  Hepatitis C blood test.  Hepatitis B blood test.  Sexually transmitted disease (STD) testing.  Diabetes screening. This is done by checking your blood sugar (glucose) after you have not eaten for a while (fasting). You may have this done every 1-3 years.  Abdominal aortic aneurysm (AAA) screening. You may need this if you are a current or former smoker.  Osteoporosis. You may be screened starting at age 41 if you are at high risk. Talk with your health care provider about your test results, treatment options,  and if necessary, the need for more tests. Vaccines  Your health care provider may recommend certain vaccines, such as:  Influenza vaccine. This is recommended every year.  Tetanus, diphtheria, and acellular pertussis (Tdap, Td) vaccine. You may need a Td booster every 10  years.  Zoster vaccine. You may need this after age 14.  Pneumococcal 13-valent conjugate (PCV13) vaccine. One dose is recommended after age 1.  Pneumococcal polysaccharide (PPSV23) vaccine. One dose is recommended after age 46. Talk to your health care provider about which screenings and vaccines you need and how often you need them. This information is not intended to replace advice given to you by your health care provider. Make sure you discuss any questions you have with your health care provider. Document Released: 05/18/2015 Document Revised: 01/09/2016 Document Reviewed: 02/20/2015 Elsevier Interactive Patient Education  2017 [place] Prevention in the Home Falls can cause injuries. They can happen to people of all ages. There are many things you can do to make your home safe and to help prevent falls. What can I do on the outside of my home?  Regularly fix the edges of walkways and driveways and fix any cracks.  Remove anything that might make you trip as you walk through a door, such as a raised step or threshold.  Trim any bushes or trees on the path to your home.  Use bright outdoor lighting.  Clear any walking paths of anything that might make someone trip, such as rocks or tools.  Regularly check to see if handrails are loose or broken. Make sure that both sides of any steps have handrails.  Any raised decks and porches should have guardrails on the edges.  Have any leaves, snow, or ice cleared regularly.  Use sand or salt on walking paths during winter.  Clean up any spills in your garage right away. This includes oil or grease spills. What can I do in the bathroom?  Use night lights.  Install grab bars by the toilet and in the tub and shower. Do not use towel bars as grab bars.  Use non-skid mats or decals in the tub or shower.  If you need to sit down in the shower, use a plastic, non-slip stool.  Keep the floor dry. Clean up any water that  spills on the floor as soon as it happens.  Remove soap buildup in the tub or shower regularly.  Attach bath mats securely with double-sided non-slip rug tape.  Do not have throw rugs and other things on the floor that can make you trip. What can I do in the bedroom?  Use night lights.  Make sure that you have a light by your bed that is easy to reach.  Do not use any sheets or blankets that are too big for your bed. They should not hang down onto the floor.  Have a firm chair that has side arms. You can use this for support while you get dressed.  Do not have throw rugs and other things on the floor that can make you trip. What can I do in the kitchen?  Clean up any spills right away.  Avoid walking on wet floors.  Keep items that you use a lot in easy-to-reach places.  If you need to reach something above you, use a strong step stool that has a grab bar.  Keep electrical cords out of the way.  Do not use floor polish or wax that makes floors slippery. If  you must use wax, use non-skid floor wax.  Do not have throw rugs and other things on the floor that can make you trip. What can I do with my stairs?  Do not leave any items on the stairs.  Make sure that there are handrails on both sides of the stairs and use them. Fix handrails that are broken or loose. Make sure that handrails are as long as the stairways.  Check any carpeting to make sure that it is firmly attached to the stairs. Fix any carpet that is loose or worn.  Avoid having throw rugs at the top or bottom of the stairs. If you do have throw rugs, attach them to the floor with carpet tape.  Make sure that you have a light switch at the top of the stairs and the bottom of the stairs. If you do not have them, ask someone to add them for you. What else can I do to help prevent falls?  Wear shoes that:  Do not have high heels.  Have rubber bottoms.  Are comfortable and fit you well.  Are closed at the  toe. Do not wear sandals.  If you use a stepladder:  Make sure that it is fully opened. Do not climb a closed stepladder.  Make sure that both sides of the stepladder are locked into place.  Ask someone to hold it for you, if possible.  Clearly mark and make sure that you can see:  Any grab bars or handrails.  First and last steps.  Where the edge of each step is.  Use tools that help you move around (mobility aids) if they are needed. These include:  Canes.  Walkers.  Scooters.  Crutches.  Turn on the lights when you go into a dark area. Replace any light bulbs as soon as they burn out.  Set up your furniture so you have a clear path. Avoid moving your furniture around.  If any of your floors are uneven, fix them.  If there are any pets around you, be aware of where they are.  Review your medicines with your doctor. Some medicines can make you feel dizzy. This can increase your chance of falling. Ask your doctor what other things that you can do to help prevent falls. This information is not intended to replace advice given to you by your health care provider. Make sure you discuss any questions you have with your health care provider. Document Released: 02/15/2009 Document Revised: 09/27/2015 Document Reviewed: 05/26/2014 Elsevier Interactive Patient Education  2017 [employer].

## 2017-03-24 ENCOUNTER — Ambulatory Visit: Payer: PPO | Admitting: Family Medicine

## 2017-03-27 ENCOUNTER — Encounter: Payer: Self-pay | Admitting: Family Medicine

## 2017-03-27 ENCOUNTER — Other Ambulatory Visit: Payer: Self-pay

## 2017-03-27 ENCOUNTER — Ambulatory Visit: Payer: PPO | Admitting: Family Medicine

## 2017-03-27 VITALS — BP 138/82 | HR 65 | Temp 98.6°F | Resp 16 | Ht 70.67 in | Wt 249.0 lb

## 2017-03-27 DIAGNOSIS — N529 Male erectile dysfunction, unspecified: Secondary | ICD-10-CM

## 2017-03-27 DIAGNOSIS — I10 Essential (primary) hypertension: Secondary | ICD-10-CM | POA: Diagnosis not present

## 2017-03-27 DIAGNOSIS — R9431 Abnormal electrocardiogram [ECG] [EKG]: Secondary | ICD-10-CM

## 2017-03-27 MED ORDER — LISINOPRIL-HYDROCHLOROTHIAZIDE 10-12.5 MG PO TABS: 1.0000 | ORAL_TABLET | Freq: Every day | ORAL | 2 refills | Status: DC

## 2017-03-27 NOTE — Patient Instructions (Addendum)
  Exercise most days per week - low intensity to begin with (walking, swimming low intensity).   Follow up with cardiologist to discuss any workup needed for higher intensity exercise. If cardiology clears you to take Viagra, I can send some in. Let me know.   Try to decrease alcohol to no more than 1-2 total per day, ideally less is better.   No change in meds for now. Follow up in around 6 months for blood pressure.     IF you received an x-ray today, you will receive an invoice from [facility]. Please contact [facility] at [phone] with questions or concerns regarding your invoice.   IF you received labwork today, you will receive an invoice from [place]. Please contact [facility] at [phone] with questions or concerns regarding your invoice.   Our billing staff will not be able to assist you with questions regarding bills from these companies.  You will be contacted with the lab results as soon as they are available. The fastest way to get your results is to activate your My Chart account. Instructions are located on the last page of this paperwork. If you have not heard from [place] regarding the results in 2 weeks, please contact this office.

## 2017-03-27 NOTE — Progress Notes (Signed)
Subjective:  By signing my name below, I, Brian Mcguire, attest that this documentation has been prepared under the direction and in the presence of Brian Ray, MD. Electronically Signed: Moises Mcguire, [place]. 03/27/2017 , 3:20 PM .  Patient was seen in Room 12 .   Patient ID: Brian Mcguire, male    DOB: 1950-11-27, 66 y.o.   MRN: [id number] Chief Complaint  Patient presents with  . Hypertension    follow-up  . Erectile Dysfunction   HPI Brian Mcguire is a 66 y.o. male Here for follow up of HTN.   HTN He takes Lisinopril-HCTZ 10-12.5mg  QD. He was referred to cardiology for borderline abnormal EKG in 2017 with physical with Dr. Everlene Farrier. There were some concern for alcohol overuse at that time; plan to cut back.   Patient states he ran out of his medication about 8 days ago (last Saturday). He hasn't checked his BP outside of the office regularly. He denies any known side effects with his medications. He denies lightheadedness or dizziness.   Patient mentions drinking a fair amount of black coffee. He's cut out eating junk food. He hasn't had regular exercise for a long time now. He occasionally plays golf, and rides cart instead of walking. He denies chest tightness or pressure when playing. He's still drinking alcoholic beverage about 0-6 drinks a day, up to 24 drinks a week. He's cut back amount since last visit with Dr. Everlene Farrier in 2017. He denies having alcohol at home. He also denies addiction or dependency of alcohol.   He was referred to Dr. Einar Mcguire last year after borderline abnormal EKG. Referral was closed on Aug 22nd, unable to contact after multiple attempts. His EKG last year showed poor R-wave progression with some negative precordial T-waves, sinus bradycardia, rate 51.   Health maintenance He received Prevnar last year; plan for pneumovax today.   Erectile Dysfunction He was prescribed sildenafil 100mg  in April this year. He took a couple quarter tablets  intermittently. He denies any side effects.   Patient Active Problem List   Diagnosis Date Noted  . Substance dependency ([place]) 12/03/2015   History reviewed. No pertinent past medical history. Past Surgical History:  Procedure Laterality Date  . CHOLECYSTECTOMY    . COLON SURGERY     No Known Allergies Prior to Admission medications   Medication Sig Start Date End Date Taking? Authorizing Provider  lisinopril-hydrochlorothiazide (PRINZIDE,ZESTORETIC) 10-12.5 MG tablet Take 1 tablet by mouth daily. **NO FURTHER REFILLS WILL BE GIVEN** 03/06/17  Yes Tereasa Coop, PA-C   Social History   Socioeconomic History  . Marital status: Divorced    Spouse name: Not on file  . Number of children: 2  . Years of education: Not on file  . Highest education level: Some college, no degree  Social Needs  . Financial resource strain: Not hard at all  . Food insecurity - worry: Never true  . Food insecurity - inability: Never true  . Transportation needs - medical: No  . Transportation needs - non-medical: No  Occupational History  . Not on file  Tobacco Use  . Smoking status: Never Smoker  . Smokeless tobacco: Never Used  Substance and Sexual Activity  . Alcohol use: Yes    Alcohol/week: 3.0 - 3.6 oz    Types: 5 - 6 Standard drinks or equivalent per week  . Drug use: No  . Sexual activity: No  Other Topics Concern  . Not on file  Social History Narrative  .  Not on file   Review of Systems  Constitutional: Negative for fatigue and unexpected weight change.  Eyes: Negative for visual disturbance.  Respiratory: Negative for cough, chest tightness and shortness of breath.   Cardiovascular: Negative for chest pain, palpitations and leg swelling.  Gastrointestinal: Negative for abdominal pain and Mcguire in stool.  Neurological: Negative for dizziness, light-headedness and headaches.       Objective:   Physical Exam  Constitutional: He is oriented to person, place, and time. He  appears well-developed and well-nourished.  HENT:  Head: Normocephalic and atraumatic.  Eyes: EOM are normal. Pupils are equal, round, and reactive to light.  Neck: No JVD present. Carotid bruit is not present.  Cardiovascular: Normal rate, regular rhythm and normal heart sounds.  No murmur heard. Pulmonary/Chest: Effort normal and breath sounds normal. He has no rales.  Musculoskeletal: He exhibits no edema.  Neurological: He is alert and oriented to person, place, and time.  Skin: Skin is warm and dry.  Psychiatric: He has a normal mood and affect.  Vitals reviewed.   Vitals:   03/27/17 1437  BP: 138/82  Pulse: 65  Resp: 16  Temp: 98.6 F (37 C)  TempSrc: Oral  SpO2: 98%  Weight: 249 lb (112.9 kg)  Height: 5' 10.67" (1.795 m)   EKG: occasional PVC, sinus rhythm rate 71; no apparent significant change from July 2017.     Assessment & Plan:   [name] is a 66 y.o. male Nonspecific abnormal electrocardiogram (ECG) (EKG) - Plan: Ambulatory referral to Cardiology, EKG 12-Lead  -Asymptomatic, but still recommended evaluation with cardiology to make sure there has not been some silent heart disease, as multiple risk factors. EKG appears to be overall stable from previous visit. Referral placed again to Dr. Einar Mcguire.  Asymptomatic hypertension - Plan: lisinopril-hydrochlorothiazide (PRINZIDE,ZESTORETIC) 10-12.5 MG tablet, Ambulatory referral to Cardiology, Basic metabolic panel  - Borderline elevated in office but off medications. He may not need to be on as high of a dose of meds, but overall has been tolerated. Restart same dose of lisinopril HCTZ, RTC precautions. Labs pending  -Discussed decreasing alcohol to no more than 1-2 drinks per day, lower amounts better with history of hypertension.  Erectile dysfunction, unspecified erectile dysfunction type   Side effects discussed (including but not limited to headache/flushing, blue discoloration of vision, possible vascular  steal and risk of cardiac effects if underlying unknown coronary artery disease, and permanent sensorineural hearing loss) for Viagra, but will hold on medications for now. Will discuss further with cardiology if these medications are safe for him. Can prescribe medication if needed without office visit if he has been cleared to do so by cardiology.   Recheck 6 months  Meds ordered this encounter  Medications  . lisinopril-hydrochlorothiazide (PRINZIDE,ZESTORETIC) 10-12.5 MG tablet    Sig: Take 1 tablet by mouth daily.    Dispense:  90 tablet    Refill:  2   Patient Instructions    Exercise most days per week - low intensity to begin with (walking, swimming low intensity).   Follow up with cardiologist to discuss any workup needed for higher intensity exercise. If cardiology clears you to take Viagra, I can send some in. Let me know.   Try to decrease alcohol to no more than 1-2 total per day, ideally less is better.   No change in meds for now. Follow up in around 6 months for Mcguire pressure.     IF you received an x-Mcguire today,  you will receive an invoice from [facility]. Please contact [facility] at [phone] with questions or concerns regarding your invoice.   IF you received labwork today, you will receive an invoice from [place]. Please contact [facility] at [phone] with questions or concerns regarding your invoice.   Our billing staff will not be able to assist you with questions regarding bills from these companies.  You will be contacted with the lab results as soon as they are available. The fastest way to get your results is to activate your My Chart account. Instructions are located on the last page of this paperwork. If you have not heard from [place] regarding the results in 2 weeks, please contact this office.       I personally performed the services described in this documentation, which was scribed in my presence. The recorded information has  been reviewed and considered for accuracy and completeness, addended by me as needed, and agree with information above.  Signed,   Brian Ray, MD [facility].  03/29/17 12:04 PM \

## 2017-03-28 LAB — BASIC METABOLIC PANEL
BUN / CREAT RATIO: 15 (ref 10–24)
BUN: 15 mg/dL (ref 8–27)
CHLORIDE: 99 mmol/L (ref 96–106)
CO2: 26 mmol/L (ref 20–29)
Calcium: 9.5 mg/dL (ref 8.6–10.2)
Creatinine, Ser: 1.03 mg/dL (ref 0.76–1.27)
GFR calc non Af Amer: 75 mL/min/{1.73_m2} (ref >59)
GFR, EST AFRICAN AMERICAN: 87 mL/min/{1.73_m2} (ref >59)
Glucose: 88 mg/dL (ref 65–99)
POTASSIUM: 4.4 mmol/L (ref 3.5–5.2)
SODIUM: 136 mmol/L (ref 134–144)

## 2017-05-07 DIAGNOSIS — I10 Essential (primary) hypertension: Secondary | ICD-10-CM | POA: Diagnosis not present

## 2017-05-07 DIAGNOSIS — Z0189 Encounter for other specified special examinations: Secondary | ICD-10-CM | POA: Diagnosis not present

## 2017-05-07 DIAGNOSIS — R0602 Shortness of breath: Secondary | ICD-10-CM | POA: Diagnosis not present

## 2017-05-18 DIAGNOSIS — R079 Chest pain, unspecified: Secondary | ICD-10-CM | POA: Diagnosis not present

## 2017-05-18 DIAGNOSIS — I10 Essential (primary) hypertension: Secondary | ICD-10-CM | POA: Diagnosis not present

## 2017-05-18 DIAGNOSIS — R0789 Other chest pain: Secondary | ICD-10-CM | POA: Diagnosis not present

## 2017-05-21 DIAGNOSIS — Z0189 Encounter for other specified special examinations: Secondary | ICD-10-CM | POA: Diagnosis not present

## 2017-05-21 DIAGNOSIS — I10 Essential (primary) hypertension: Secondary | ICD-10-CM | POA: Diagnosis not present

## 2017-05-21 DIAGNOSIS — R0602 Shortness of breath: Secondary | ICD-10-CM | POA: Diagnosis not present

## 2017-08-07 ENCOUNTER — Telehealth: Payer: Self-pay | Admitting: Family Medicine

## 2017-08-07 DIAGNOSIS — N529 Male erectile dysfunction, unspecified: Secondary | ICD-10-CM

## 2017-08-07 NOTE — Telephone Encounter (Signed)
Pt came in stating that he is needing the generic Viagra that he spoke with Carlota Raspberry about at last visit (03/2017).  I did notify the pt that if he has been cleared by his cardiologist then he will call some in for him. He states that he was already cleared.  He also does not want it called into the pharmacy.  He wants it printed so he can take it to the pharmacy.  Please advise  [phone]

## 2017-08-07 NOTE — Telephone Encounter (Signed)
Please advise 

## 2017-08-09 NOTE — Telephone Encounter (Signed)
I reviewed the last 2 notes from cardiology and unfortunately do not see anything mentioned about use of Viagra.  I do not think it would necessarily be a problem, but ultimately would like to have cardiology comment to make sure it is okay to use.  It appeared on the last note in January that he was planning on follow-up in 8 weeks with cardiology.  Does he have an upcoming appointment with cardiology?

## 2017-08-10 NOTE — Telephone Encounter (Signed)
Pt given information per Dr Carlota Raspberry (see telephone encounter dated 08/09/17); he states that he saw someone at [facility] and had a stress test and ekg January; conference call placed with Assunta Gambles, and confirms that the pt was to follow up in 8 weeks with cardiology; the pt states that he will sort that out; Charleston Poot will make Dr Nyoka Cowden aware; the pt is requesting a written prescription; will route this encounter to office for provider review.

## 2017-08-10 NOTE — Telephone Encounter (Signed)
Incoming call from patient with call center RN Katrice. Patient states that he had mentioned to cardiologist at the end of his last appointment in January that he was taking Viagra occassionally and asked if it was okay- he states he was told yes. Brian Mcguire has a prescription for Viagra that he lost- this is his reason for call. He states he has not been back for a follow up appointment with Cardiology. He is hoping he can pick up a written prescription for generic Viagra from Dr. Carlota Raspberry due to varying medication cost.   Provider, please generate written prescription for patient pick up if agreeable.

## 2017-08-10 NOTE — Telephone Encounter (Signed)
Called pt. LVM to call back. CRM entered.

## 2017-08-14 MED ORDER — SILDENAFIL CITRATE 100 MG PO TABS: 50.0000 mg | ORAL_TABLET | Freq: Every day | ORAL | 11 refills | Status: DC | PRN

## 2017-08-14 NOTE — Telephone Encounter (Signed)
Spoke with advanced practice provider at his cardiology office 2 days ago, waiting on call back regarding clearance for Viagra.

## 2017-08-14 NOTE — Telephone Encounter (Signed)
Noted. Rx sent for Viagra.

## 2017-08-14 NOTE — Telephone Encounter (Signed)
Call from [facility].  Per Jeri Lager, NP, pt is cleared for Viagra.  Pt is not on any other nitrates.  Office will be faxing copy of note from the chart.

## 2017-08-18 ENCOUNTER — Telehealth: Payer: Self-pay | Admitting: Physician Assistant

## 2017-08-18 NOTE — Telephone Encounter (Signed)
Copied from [place] [phone]. Topic: General - Other >> Aug 18, 2017  1:55 PM Darl Householder, RMA wrote: Reason for CRM: Patient is requesting a hand written script for sildenafil (VIAGRA) 100 MG tablet, please call pt when prescription is ready for pick up

## 2017-08-19 NOTE — Telephone Encounter (Signed)
Pt is requesting a hand written/hard copy RX. Pt states that the price differences are atronomical. Pt states that he would like RX written for viagra 20mg  for the maximum # of doses per month because it is the least expensive. Pt wants to be able to check with different pharmacies for prices. It was sent to [facility] for 100mg  and he doesn't want that. Pt is requesting a call back today to advise. He says he was told he would get a call yesterday.   Call back # [phone]

## 2017-08-19 NOTE — Telephone Encounter (Signed)
Phone call to patient to clarify- does he want 100mg  tablets or 20 mg tablets?  Patient states he would like written prescription for sildenafil 20mg . He is hoping he can pick this up today. Patient advised of 48-72 hour turnaround time for prescription requests, he verbalizes understanding.   Provider, please print out prescription for patient if agreeable.

## 2017-08-19 NOTE — Telephone Encounter (Signed)
Its been a year.  I really should see him. Philis Fendt, MS, PA-C 4:18 PM, 08/19/2017

## 2017-08-19 NOTE — Telephone Encounter (Signed)
Please schedule

## 2017-08-20 NOTE — Telephone Encounter (Signed)
Called pt to schedule per note-   pt is HIGHLY mad at the situation going on with this RX. He siad that he had been on the phone for 5-6 days trying to get this worked out. He is mad that nobody has called him to tell him the RX is ready. He states that Brian Mcguire is NOT his provider, that Brian Mcguire is his provider and he doesn't understand why we are saying that Brian Mcguire is.   He wants a hard copy of this RX so he can "price shop" at the different pharmacies to get the best price. He is not happy that we sent the RX to a pharmacy after he specifically asked for a hard copy and told us NOT to send it.  Please advise. He would like a call back today. [phone]

## 2017-08-21 ENCOUNTER — Telehealth: Payer: Self-pay

## 2017-08-21 NOTE — Telephone Encounter (Signed)
Rx handled by Dr. Carlota Raspberry, who saw the pt most recently.  Completing entry and will f/u with message to [place].

## 2017-08-21 NOTE — Telephone Encounter (Signed)
Pt states Rx at 100mg  is too expensive at the pharmacy and they will not lower dose for him.  Would like to be given paper script for 20mg  dose so that he can take to other pharmacies to find the lowest price.  Rx was transferred to a different pharmacy Engineer, building services at [facility] [phone]).  I have confirmed that 08/14/17 refill was NOT picked up and had pharmacy cancel the Rx.    Pt would like to be notified when paper Rx ready for pick up.    Pended refill at new dose. (Lowest available dose appears to be 25mg .  L/m for pt advising him of 25mg  vs his requested dose of 20mg .)  Please confirm dosing/instructions.

## 2017-08-21 NOTE — Addendum Note (Signed)
Addended by: Chaney Malling on: 08/21/2017 01:34 PM   Modules accepted: Orders

## 2017-08-21 NOTE — Addendum Note (Signed)
Addended by: [name] R on: 08/21/2017 02:18 PM   Modules accepted: Orders

## 2017-08-22 ENCOUNTER — Telehealth: Payer: Self-pay | Admitting: Family Medicine

## 2017-08-22 MED ORDER — SILDENAFIL CITRATE 20 MG PO TABS: ORAL_TABLET | ORAL | 3 refills | Status: DC

## 2017-08-22 NOTE — Telephone Encounter (Signed)
Printed, ready for pick-up

## 2017-08-22 NOTE — Telephone Encounter (Signed)
Pt. Calling about hard script for sildenafil 20 mg. Pt. Asserts hard script was supposed to be ready for pick up today. Message trail starting 08/18/17 details the issue. I can't find record of [place] preparing the script or informing the pt. Of this.

## 2017-08-22 NOTE — Telephone Encounter (Signed)
I have attempted to call patient and inform him that his script is ready for pick up. There was no answer so I left a message to call back.   Thanks, [employer] in pt pick up area

## 2017-08-22 NOTE — Addendum Note (Signed)
Addended by: [name] R on: 08/22/2017 10:26 AM   Modules accepted: Orders

## 2018-01-07 ENCOUNTER — Telehealth: Payer: Self-pay | Admitting: Family Medicine

## 2018-01-07 NOTE — Telephone Encounter (Signed)
Copied from [place] [phone]. Topic: Quick Communication - Rx Refill/Question >> Jan 07, 2018  3:40 PM Carolyn Stare wrote: Medication sildenafil (REVATIO) 20 MG tablet       pt is asking if he can pick up a hand written RX for the  sildenafil (REVATIO) 20 MG tablet  Has the patient contacted their pharmacy yes    Preferred Pharmacy  [facility] [place]: Please be advised that RX refills may take up to 3 business days. We ask that you follow-up with your pharmacy.

## 2018-01-08 ENCOUNTER — Telehealth: Payer: Self-pay

## 2018-01-08 DIAGNOSIS — N529 Male erectile dysfunction, unspecified: Secondary | ICD-10-CM

## 2018-01-08 NOTE — Telephone Encounter (Signed)
Pt requesting sildenafil 20 mg tablet refill.  Would like refill sent to [facility] on [address]. Last ov: 03/27/17.  Last filled 08/23/27 #20 with 3 refills Please advise. Dgaddy, CMA

## 2018-01-09 ENCOUNTER — Other Ambulatory Visit: Payer: Self-pay | Admitting: *Deleted

## 2018-01-09 DIAGNOSIS — N529 Male erectile dysfunction, unspecified: Secondary | ICD-10-CM

## 2018-01-09 MED ORDER — SILDENAFIL CITRATE 20 MG PO TABS: ORAL_TABLET | ORAL | 2 refills | Status: DC

## 2018-01-09 NOTE — Telephone Encounter (Signed)
Refilled, but will need to schedule appt in November. Requested printed rx prior. I am out of office but will be in to sign it in next 2 days.

## 2018-01-12 NOTE — Telephone Encounter (Signed)
Called pt and informed him Rx has been sent to pharmacy.

## 2018-01-29 NOTE — Telephone Encounter (Signed)
Closing encounter

## 2018-06-08 DIAGNOSIS — H524 Presbyopia: Secondary | ICD-10-CM | POA: Diagnosis not present

## 2018-06-08 DIAGNOSIS — H25013 Cortical age-related cataract, bilateral: Secondary | ICD-10-CM | POA: Diagnosis not present

## 2018-06-08 DIAGNOSIS — H52203 Unspecified astigmatism, bilateral: Secondary | ICD-10-CM | POA: Diagnosis not present

## 2018-06-08 DIAGNOSIS — H5213 Myopia, bilateral: Secondary | ICD-10-CM | POA: Diagnosis not present

## 2018-06-08 DIAGNOSIS — H2513 Age-related nuclear cataract, bilateral: Secondary | ICD-10-CM | POA: Diagnosis not present

## 2018-06-09 DIAGNOSIS — L570 Actinic keratosis: Secondary | ICD-10-CM | POA: Diagnosis not present

## 2018-06-09 DIAGNOSIS — D229 Melanocytic nevi, unspecified: Secondary | ICD-10-CM | POA: Diagnosis not present

## 2018-06-28 ENCOUNTER — Other Ambulatory Visit: Payer: Self-pay | Admitting: Family Medicine

## 2018-06-28 DIAGNOSIS — I10 Essential (primary) hypertension: Secondary | ICD-10-CM

## 2018-07-02 ENCOUNTER — Other Ambulatory Visit: Payer: Self-pay | Admitting: Cardiology

## 2018-07-08 ENCOUNTER — Other Ambulatory Visit: Payer: Self-pay | Admitting: Family Medicine

## 2018-07-08 DIAGNOSIS — N529 Male erectile dysfunction, unspecified: Secondary | ICD-10-CM

## 2018-07-08 NOTE — Telephone Encounter (Signed)
Requested Prescriptions  Pending Prescriptions Disp Refills  . sildenafil (REVATIO) 20 MG tablet [Pharmacy Med Name: SILDENAFIL 20 MG TABLET] 6 tablet 0    Sig: TAKE 1 TO 2 TABLETS BY MOUTH ONCE PRIOR TO SEXUAL ACTIVITY; needs office visit for further refills     Urology: Erectile Dysfunction Agents Failed - 07/08/2018  8:24 AM      Failed - Valid encounter within last 12 months    Recent Outpatient Visits          1 year ago Nonspecific abnormal electrocardiogram (ECG) (EKG)   [facility], Ranell Patrick, MD   1 year ago Erectile dysfunction, unspecified erectile dysfunction type   [facility], Audrie Lia, PA-C   1 year ago Persistent cough for 3 weeks or longer   [facility] [place], PA-C   2 years ago Annual physical exam   [facility], [facility]   3 years ago Sore throat   [facility], MD      Future Appointments            In 4 days Wendie Agreste, MD [facility], [place] BP in normal range    BP Readings from Last 1 Encounters:  03/27/17 138/82

## 2018-07-12 ENCOUNTER — Other Ambulatory Visit: Payer: Self-pay

## 2018-07-12 ENCOUNTER — Encounter: Payer: Self-pay | Admitting: Family Medicine

## 2018-07-12 ENCOUNTER — Ambulatory Visit (INDEPENDENT_AMBULATORY_CARE_PROVIDER_SITE_OTHER): Payer: PPO | Admitting: Family Medicine

## 2018-07-12 VITALS — BP 140/82 | HR 93 | Temp 97.3°F | Resp 16 | Ht 71.0 in | Wt 241.6 lb

## 2018-07-12 DIAGNOSIS — Z0000 Encounter for general adult medical examination without abnormal findings: Secondary | ICD-10-CM

## 2018-07-12 DIAGNOSIS — I10 Essential (primary) hypertension: Secondary | ICD-10-CM | POA: Diagnosis not present

## 2018-07-12 DIAGNOSIS — Z0001 Encounter for general adult medical examination with abnormal findings: Secondary | ICD-10-CM | POA: Diagnosis not present

## 2018-07-12 DIAGNOSIS — Z125 Encounter for screening for malignant neoplasm of prostate: Secondary | ICD-10-CM | POA: Diagnosis not present

## 2018-07-12 DIAGNOSIS — N529 Male erectile dysfunction, unspecified: Secondary | ICD-10-CM | POA: Diagnosis not present

## 2018-07-12 MED ORDER — AMLODIPINE BESYLATE 5 MG PO TABS: 5.0000 mg | ORAL_TABLET | Freq: Every day | ORAL | 1 refills | Status: DC

## 2018-07-12 MED ORDER — SILDENAFIL CITRATE 20 MG PO TABS: ORAL_TABLET | ORAL | 1 refills | Status: DC

## 2018-07-12 MED ORDER — LISINOPRIL-HYDROCHLOROTHIAZIDE 20-25 MG PO TABS: 1.0000 | ORAL_TABLET | Freq: Every day | ORAL | 1 refills | Status: DC

## 2018-07-12 NOTE — Patient Instructions (Addendum)
Keep a record of your blood pressures outside of the office and bring them to the next office visit. If running over 140/90 - return sooner.   Let me know if you change your mind about the flu, pneumonia and shingles vaccines.   No med changes for now. Thanks for coming in today.    Preventive Care 11 Years and Older, Male Preventive care refers to lifestyle choices and visits with your health care provider that can promote health and wellness. What does preventive care include?   A yearly physical exam. This is also called an annual well check.  Dental exams once or twice a year.  Routine eye exams. Ask your health care provider how often you should have your eyes checked.  Personal lifestyle choices, including: ? Daily care of your teeth and gums. ? Regular physical activity. ? Eating a healthy diet. ? Avoiding tobacco and drug use. ? Limiting alcohol use. ? Practicing safe sex. ? Taking low doses of aspirin every day. ? Taking vitamin and mineral supplements as recommended by your health care provider. What happens during an annual well check? The services and screenings done by your health care provider during your annual well check will depend on your age, overall health, lifestyle risk factors, and family history of disease. Counseling Your health care provider may ask you questions about your:  Alcohol use.  Tobacco use.  Drug use.  Emotional well-being.  Home and relationship well-being.  Sexual activity.  Eating habits.  History of falls.  Memory and ability to understand (cognition).  Work and work Statistician. Screening You may have the following tests or measurements:  Height, weight, and BMI.  Blood pressure.  Lipid and cholesterol levels. These may be checked every 5 years, or more frequently if you are over 68 years old.  Skin check.  Lung cancer screening. You may have this screening every year starting at age 68 if you have a 30-pack-year  history of smoking and currently smoke or have quit within the past 15 years.  Colorectal cancer screening. All adults should have this screening starting at age 68 and continuing until age 68. You will have tests every 1-10 years, depending on your results and the type of screening test. People at increased risk should start screening at an earlier age. Screening tests may include: ? Guaiac-based fecal occult blood testing. ? Fecal immunochemical test (FIT). ? Stool DNA test. ? Virtual colonoscopy. ? Sigmoidoscopy. During this test, a flexible tube with a tiny camera (sigmoidoscope) is used to examine your rectum and lower colon. The sigmoidoscope is inserted through your anus into your rectum and lower colon. ? Colonoscopy. During this test, a long, thin, flexible tube with a tiny camera (colonoscope) is used to examine your entire colon and rectum.  Prostate cancer screening. Recommendations will vary depending on your family history and other risks.  Hepatitis C blood test.  Hepatitis B blood test.  Sexually transmitted disease (STD) testing.  Diabetes screening. This is done by checking your blood sugar (glucose) after you have not eaten for a while (fasting). You may have this done every 1-3 years.  Abdominal aortic aneurysm (AAA) screening. You may need this if you are a current or former smoker.  Osteoporosis. You may be screened starting at age 68 if you are at high risk. Talk with your health care provider about your test results, treatment options, and if necessary, the need for more tests. Vaccines Your health care provider may recommend certain vaccines,  such as:  Influenza vaccine. This is recommended every year.  Tetanus, diphtheria, and acellular pertussis (Tdap, Td) vaccine. You may need a Td booster every 10 years.  Varicella vaccine. You may need this if you have not been vaccinated.  Zoster vaccine. You may need this after age 68.  Measles, mumps, and rubella  (MMR) vaccine. You may need at least one dose of MMR if you were born in 1957 or later. You may also need a second dose.  Pneumococcal 13-valent conjugate (PCV13) vaccine. One dose is recommended after age 68.  Pneumococcal polysaccharide (PPSV23) vaccine. One dose is recommended after age 68.  Meningococcal vaccine. You may need this if you have certain conditions.  Hepatitis A vaccine. You may need this if you have certain conditions or if you travel or work in places where you may be exposed to hepatitis A.  Hepatitis B vaccine. You may need this if you have certain conditions or if you travel or work in places where you may be exposed to hepatitis B.  Haemophilus influenzae type b (Hib) vaccine. You may need this if you have certain risk factors. Talk to your health care provider about which screenings and vaccines you need and how often you need them. This information is not intended to replace advice given to you by your health care provider. Make sure you discuss any questions you have with your health care provider. Document Released: 05/18/2015 Document Revised: 06/11/2017 Document Reviewed: 02/20/2015 Elsevier Interactive Patient Education  [employer].    If you have lab work done today you will be contacted with your lab results within the next 2 weeks.  If you have not heard from [place] then please contact us. The fastest way to get your results is to register for My Chart.   IF you received an x-ray today, you will receive an invoice from [facility]. Please contact [facility] at [phone] with questions or concerns regarding your invoice.   IF you received labwork today, you will receive an invoice from [facility]. Please contact [facility] at [phone] with questions or concerns regarding your invoice.   Our billing staff will not be able to assist you with questions regarding bills from these companies.  You will be contacted with the lab results  as soon as they are available. The fastest way to get your results is to activate your My Chart account. Instructions are located on the last page of this paperwork. If you have not heard from [place] regarding the results in 2 weeks, please contact this office.

## 2018-07-12 NOTE — Progress Notes (Signed)
Subjective:    Patient ID: Brian Mcguire, male    DOB: [birth year], 68 y.o.   MRN: [id number]  HPI Brian Mcguire is a 68 y.o. male Presents today for: Chief Complaint  Patient presents with  . Medicare Wellness    here for wellness today . patient is not fasting today.   Hypertension: BP Readings from Last 3 Encounters:  07/12/18 140/82  03/27/17 138/82  03/23/17 128/80   Lab Results  Component Value Date   CREATININE 1.03 03/27/2017  Currently on amlodipine 5 mg daily, lisinopril HCTZ 20/25 mg. No home readings, no new side effects of meds.   Erectile dysfunction: Has used sildenafil 20 mg tablet as needed in the past - not needing every time. Uses 1 at a time, and is effective. Rarely uses 2. No acute hearing change, no blue vision, no chest pains with exercise/exertion.    Cancer screening: Colonoscopy 11/26/2015 Prostate, PSA 0.28 in July 2017. Agrees to testing today.  Derm eval 1 month ago.   Immunization History  Administered Date(s) Administered  . Pneumococcal Conjugate-13 12/03/2015  Pneumovax, tetanus, influenza vaccine as well as shingles vaccines all recommended and declined.   No falls in the past year  Depression screen [facility] 2/9 07/12/2018 03/27/2017 03/23/2017 08/06/2016 08/02/2016  Decreased Interest 0 0 0 0 0  Down, Depressed, Hopeless 0 0 0 0 0  PHQ - 2 Score 0 0 0 0 0   Functional Status Survey: Is the patient deaf or have difficulty hearing?: Yes(a little) Does the patient have difficulty seeing, even when wearing glasses/contacts?: No Does the patient have difficulty concentrating, remembering, or making decisions?: No Does the patient have difficulty walking or climbing stairs?: No Does the patient have difficulty dressing or bathing?: No Does the patient have difficulty doing errands alone such as visiting a doctor's office or shopping?: No  hearing: plans on having screen with audiology.   6CIT Screen 07/12/2018 03/23/2017  What Year? 0  points 0 points  What month? 0 points 0 points  What time? 0 points 0 points  Count back from 20 0 points 0 points  Months in reverse 0 points 0 points  Repeat phrase 0 points 2 points  Total Score 0 2     Visual Acuity Screening   Right eye Left eye Both eyes  Without correction: 20/40 20/30 20/30   With correction:      Dr. Gershon [place] - 1 month ago - plans in cataract procedure.   Dental: no recent visit - plans to schedule.   Exercise: on regular exercise currently. Active with walking. Plans on increasing   Advanced directives: Does not have advanced directive, paperwork provided.   Patient Active Problem List   Diagnosis Date Noted  . Substance dependency ([place]) 12/03/2015   No past medical history on file. Past Surgical History:  Procedure Laterality Date  . CHOLECYSTECTOMY    . COLON SURGERY     No Known Allergies Prior to Admission medications   Medication Sig Start Date End Date Taking? Authorizing Provider  amLODipine (NORVASC) 5 MG tablet Take 5 mg by mouth daily.   Yes [provider]  lisinopril-hydrochlorothiazide (PRINZIDE,ZESTORETIC) 20-25 MG tablet TAKE 1 TABLET BY MOUTH DAILY 07/06/18  Yes Patwardhan, Manish J, MD  sildenafil (REVATIO) 20 MG tablet TAKE 1 TO 2 TABLETS BY MOUTH ONCE PRIOR TO SEXUAL ACTIVITY; needs office visit for further refills 07/08/18  Yes Wendie Agreste, MD   Social History   Socioeconomic History  .  Marital status: Divorced    Spouse name: Not on file  . Number of children: 2  . Years of education: Not on file  . Highest education level: Some college, no degree  Occupational History  . Not on file  Social Needs  . Financial resource strain: Not hard at all  . Food insecurity:    Worry: Never true    Inability: Never true  . Transportation needs:    Medical: No    Non-medical: No  Tobacco Use  . Smoking status: Never Smoker  . Smokeless tobacco: Never Used  Substance and Sexual Activity  . Alcohol use: Yes     Alcohol/week: 5.0 - 6.0 standard drinks    Types: 5 - 6 Standard drinks or equivalent per week  . Drug use: No  . Sexual activity: Never  Lifestyle  . Physical activity:    Days per week: 0 days    Minutes per session: 0 min  . Stress: Not at all  Relationships  . Social connections:    Talks on phone: More than three times a week    Gets together: More than three times a week    Attends religious service: Never    Active member of club or organization: Yes    Attends meetings of clubs or organizations: More than 4 times per year    Relationship status: Divorced  . Intimate partner violence:    Fear of current or ex partner: No    Emotionally abused: No    Physically abused: No    Forced sexual activity: No  Other Topics Concern  . Not on file  Social History Narrative  . Not on file    Review of Systems 13 point review of systems per patient health survey noted.  Negative other than as indicated above or in HPI.      Objective:   Physical Exam Vitals signs reviewed.  Constitutional:      Appearance: He is well-developed.  HENT:     Head: Normocephalic and atraumatic.     Right Ear: External ear normal.     Left Ear: External ear normal.  Eyes:     Conjunctiva/sclera: Conjunctivae normal.     Pupils: Pupils are equal, round, and reactive to light.  Neck:     Musculoskeletal: Normal range of motion and neck supple.     Thyroid: No thyromegaly.  Cardiovascular:     Rate and Rhythm: Normal rate and regular rhythm.     Heart sounds: Normal heart sounds.  Pulmonary:     Effort: Pulmonary effort is normal. No respiratory distress.     Breath sounds: Normal breath sounds. No wheezing.  Abdominal:     General: There is no distension.     Palpations: Abdomen is soft.     Tenderness: There is no abdominal tenderness.     Hernia: There is no hernia in the right inguinal area or left inguinal area.  Genitourinary:    Prostate: Normal.  Musculoskeletal: Normal range of  motion.        General: No tenderness.  Lymphadenopathy:     Cervical: No cervical adenopathy.  Skin:    General: Skin is warm and dry.  Neurological:     Mental Status: He is alert and oriented to person, place, and time.     Deep Tendon Reflexes: Reflexes are normal and symmetric.  Psychiatric:        Behavior: Behavior normal.    Vitals:   07/12/18 1406  07/12/18 1408  BP: (!) 180/90 140/82  Pulse: 93   Resp: 16   Temp: (!) 97.3 F (36.3 C)   TempSrc: Oral   SpO2: 96%   Weight: 241 lb 9.6 oz (109.6 kg)   Height: 5' 11"  (1.803 m)         Assessment & Plan:    TYRONNE BLANN is a 68 y.o. male Encounter for Medicare annual wellness exam  - - anticipatory guidance as below in AVS, screening labs if needed. Health maintenance items as above in HPI discussed/recommended as applicable.  - no concerning responses on depression, fall, or functional status screening. Any positive responses noted as above. Advanced directives discussed as in CHL.   Essential hypertension - Plan: Comprehensive metabolic panel, Microalbumin / creatinine urine ratio, amLODipine (NORVASC) 5 MG tablet, lisinopril-hydrochlorothiazide (PRINZIDE,ZESTORETIC) 20-25 MG tablet  -  Stable, tolerating current regimen. Medications refilled. Labs pending as above. ;  Screening for prostate cancer - Plan: PSA  - We discussed pros and cons of prostate cancer screening, and after this discussion, he chose to have screening done. PSA obtained, and no concerning findings on DRE.   Erectile dysfunction, unspecified erectile dysfunction type - Plan: sildenafil (REVATIO) 20 MG tablet  - sildenafil Rx given - use lowest effective dose. Side effects discussed (including but not limited to headache/flushing, blue discoloration of vision, possible vascular steal and risk of cardiac effects if underlying unknown coronary artery disease, and permanent sensorineural hearing loss). Understanding expressed.   Meds ordered this  encounter  Medications  . amLODipine (NORVASC) 5 MG tablet    Sig: Take 1 tablet (5 mg total) by mouth daily.    Dispense:  90 tablet    Refill:  1  . lisinopril-hydrochlorothiazide (PRINZIDE,ZESTORETIC) 20-25 MG tablet    Sig: Take 1 tablet by mouth daily.    Dispense:  90 tablet    Refill:  1    **Patient requests 90 days supply**  . sildenafil (REVATIO) 20 MG tablet    Sig: TAKE 1 TO 2 TABLETS BY MOUTH ONCE PRIOR TO SEXUAL ACTIVITY; needs office visit for further refills    Dispense:  30 tablet    Refill:  1    DX Code Needed:  Erectile Dysfunction, unspecified N52.9  .   Patient Instructions   Keep a record of your blood pressures outside of the office and bring them to the next office visit. If running over 140/90 - return sooner.   Let me know if you change your mind about the flu, pneumonia and shingles vaccines.   No med changes for now. Thanks for coming in today.    Preventive Care 52 Years and Older, Male Preventive care refers to lifestyle choices and visits with your health care provider that can promote health and wellness. What does preventive care include?   A yearly physical exam. This is also called an annual well check.  Dental exams once or twice a year.  Routine eye exams. Ask your health care provider how often you should have your eyes checked.  Personal lifestyle choices, including: ? Daily care of your teeth and gums. ? Regular physical activity. ? Eating a healthy diet. ? Avoiding tobacco and drug use. ? Limiting alcohol use. ? Practicing safe sex. ? Taking low doses of aspirin every day. ? Taking vitamin and mineral supplements as recommended by your health care provider. What happens during an annual well check? The services and screenings done by your health care provider during your  annual well check will depend on your age, overall health, lifestyle risk factors, and family history of disease. Counseling Your health care provider may ask  you questions about your:  Alcohol use.  Tobacco use.  Drug use.  Emotional well-being.  Home and relationship well-being.  Sexual activity.  Eating habits.  History of falls.  Memory and ability to understand (cognition).  Work and work Statistician. Screening You may have the following tests or measurements:  Height, weight, and BMI.  Blood pressure.  Lipid and cholesterol levels. These may be checked every 5 years, or more frequently if you are over 4 years old.  Skin check.  Lung cancer screening. You may have this screening every year starting at age 37 if you have a 30-pack-year history of smoking and currently smoke or have quit within the past 15 years.  Colorectal cancer screening. All adults should have this screening starting at age 57 and continuing until age 92. You will have tests every 1-10 years, depending on your results and the type of screening test. People at increased risk should start screening at an earlier age. Screening tests may include: ? Guaiac-based fecal occult blood testing. ? Fecal immunochemical test (FIT). ? Stool DNA test. ? Virtual colonoscopy. ? Sigmoidoscopy. During this test, a flexible tube with a tiny camera (sigmoidoscope) is used to examine your rectum and lower colon. The sigmoidoscope is inserted through your anus into your rectum and lower colon. ? Colonoscopy. During this test, a long, thin, flexible tube with a tiny camera (colonoscope) is used to examine your entire colon and rectum.  Prostate cancer screening. Recommendations will vary depending on your family history and other risks.  Hepatitis C blood test.  Hepatitis B blood test.  Sexually transmitted disease (STD) testing.  Diabetes screening. This is done by checking your blood sugar (glucose) after you have not eaten for a while (fasting). You may have this done every 1-3 years.  Abdominal aortic aneurysm (AAA) screening. You may need this if you are a  current or former smoker.  Osteoporosis. You may be screened starting at age 48 if you are at high risk. Talk with your health care provider about your test results, treatment options, and if necessary, the need for more tests. Vaccines Your health care provider may recommend certain vaccines, such as:  Influenza vaccine. This is recommended every year.  Tetanus, diphtheria, and acellular pertussis (Tdap, Td) vaccine. You may need a Td booster every 10 years.  Varicella vaccine. You may need this if you have not been vaccinated.  Zoster vaccine. You may need this after age 69.  Measles, mumps, and rubella (MMR) vaccine. You may need at least one dose of MMR if you were born in 1957 or later. You may also need a second dose.  Pneumococcal 13-valent conjugate (PCV13) vaccine. One dose is recommended after age 82.  Pneumococcal polysaccharide (PPSV23) vaccine. One dose is recommended after age 43.  Meningococcal vaccine. You may need this if you have certain conditions.  Hepatitis A vaccine. You may need this if you have certain conditions or if you travel or work in places where you may be exposed to hepatitis A.  Hepatitis B vaccine. You may need this if you have certain conditions or if you travel or work in places where you may be exposed to hepatitis B.  Haemophilus influenzae type b (Hib) vaccine. You may need this if you have certain risk factors. Talk to your health care provider about which screenings  and vaccines you need and how often you need them. This information is not intended to replace advice given to you by your health care provider. Make sure you discuss any questions you have with your health care provider. Document Released: 05/18/2015 Document Revised: 06/11/2017 Document Reviewed: 02/20/2015 Elsevier Interactive Patient Education  [employer].    If you have lab work done today you will be contacted with your lab results within the next 2 weeks.  If you  have not heard from [place] then please contact us. The fastest way to get your results is to register for My Chart.   IF you received an x-ray today, you will receive an invoice from [facility]. Please contact [facility] at [phone] with questions or concerns regarding your invoice.   IF you received labwork today, you will receive an invoice from [place] [facility]. Please contact [facility] at [phone] with questions or concerns regarding your invoice.   Our billing staff will not be able to assist you with questions regarding bills from these companies.  You will be contacted with the lab results as soon as they are available. The fastest way to get your results is to activate your My Chart account. Instructions are located on the last page of this paperwork. If you have not heard from [place] regarding the results in 2 weeks, please contact this office.       Signed,   [name], MD [facility] [place].  07/12/18 10:06 PM

## 2018-07-13 LAB — COMPREHENSIVE METABOLIC PANEL
ALBUMIN: 4.7 g/dL (ref 3.8–4.8)
ALT: 22 IU/L (ref 0–44)
AST: 20 IU/L (ref 0–40)
Albumin/Globulin Ratio: 2.1 (ref 1.2–2.2)
Alkaline Phosphatase: 51 IU/L (ref 39–117)
BUN/Creatinine Ratio: 14 (ref 10–24)
BUN: 16 mg/dL (ref 8–27)
Bilirubin Total: 0.6 mg/dL (ref 0.0–1.2)
CO2: 25 mmol/L (ref 20–29)
CREATININE: 1.11 mg/dL (ref 0.76–1.27)
Calcium: 9.6 mg/dL (ref 8.6–10.2)
Chloride: 97 mmol/L (ref 96–106)
GFR calc Af Amer: 79 mL/min/{1.73_m2} (ref >59)
GFR, EST NON AFRICAN AMERICAN: 68 mL/min/{1.73_m2} (ref >59)
GLUCOSE: 93 mg/dL (ref 65–99)
Globulin, Total: 2.2 g/dL (ref 1.5–4.5)
Potassium: 3.5 mmol/L (ref 3.5–5.2)
Sodium: 139 mmol/L (ref 134–144)
TOTAL PROTEIN: 6.9 g/dL (ref 6.0–8.5)

## 2018-07-13 LAB — MICROALBUMIN / CREATININE URINE RATIO
Creatinine, Urine: 48.8 mg/dL
Microalbumin, Urine: <3 ug/mL

## 2018-07-13 LAB — PSA: Prostate Specific Ag, Serum: 0.8 ng/mL (ref 0.0–4.0)

## 2018-07-18 ENCOUNTER — Other Ambulatory Visit: Payer: Self-pay | Admitting: Family Medicine

## 2018-07-18 DIAGNOSIS — N529 Male erectile dysfunction, unspecified: Secondary | ICD-10-CM

## 2018-07-19 ENCOUNTER — Encounter: Payer: Self-pay | Admitting: Radiology

## 2018-07-27 ENCOUNTER — Other Ambulatory Visit: Payer: Self-pay | Admitting: Family Medicine

## 2018-07-27 DIAGNOSIS — N529 Male erectile dysfunction, unspecified: Secondary | ICD-10-CM

## 2018-07-31 ENCOUNTER — Telehealth: Payer: Self-pay | Admitting: Family Medicine

## 2018-07-31 DIAGNOSIS — N529 Male erectile dysfunction, unspecified: Secondary | ICD-10-CM

## 2018-07-31 NOTE — Telephone Encounter (Signed)
Pt. Requests perscription for Sildenifil be transferred to [facility] at [address], as it is cheaper there.

## 2018-08-02 ENCOUNTER — Other Ambulatory Visit: Payer: Self-pay | Admitting: Emergency Medicine

## 2018-08-02 DIAGNOSIS — N529 Male erectile dysfunction, unspecified: Secondary | ICD-10-CM

## 2018-08-02 MED ORDER — SILDENAFIL CITRATE 20 MG PO TABS: ORAL_TABLET | ORAL | 1 refills | Status: DC

## 2018-08-02 NOTE — Telephone Encounter (Signed)
Done

## 2018-08-02 NOTE — Telephone Encounter (Signed)
I wasn't sure if I was able to sent this rx so I pend for your approval

## 2018-08-03 ENCOUNTER — Other Ambulatory Visit: Payer: Self-pay | Admitting: Family Medicine

## 2018-08-03 DIAGNOSIS — N529 Male erectile dysfunction, unspecified: Secondary | ICD-10-CM

## 2018-08-03 NOTE — Telephone Encounter (Signed)
Pt requested lancets and test strips to be resubmitted to [facility]

## 2018-08-10 ENCOUNTER — Other Ambulatory Visit: Payer: Self-pay | Admitting: Family Medicine

## 2018-08-10 DIAGNOSIS — N529 Male erectile dysfunction, unspecified: Secondary | ICD-10-CM

## 2019-01-13 ENCOUNTER — Ambulatory Visit: Payer: PPO | Admitting: Family Medicine

## 2019-01-17 DIAGNOSIS — H2513 Age-related nuclear cataract, bilateral: Secondary | ICD-10-CM | POA: Diagnosis not present

## 2019-01-17 DIAGNOSIS — H25013 Cortical age-related cataract, bilateral: Secondary | ICD-10-CM | POA: Diagnosis not present

## 2019-01-19 ENCOUNTER — Ambulatory Visit: Payer: PPO | Admitting: Family Medicine

## 2019-01-27 ENCOUNTER — Ambulatory Visit: Payer: PPO | Admitting: Family Medicine

## 2019-01-28 ENCOUNTER — Other Ambulatory Visit: Payer: Self-pay | Admitting: Family Medicine

## 2019-01-28 DIAGNOSIS — I10 Essential (primary) hypertension: Secondary | ICD-10-CM

## 2019-01-28 NOTE — Telephone Encounter (Signed)
Requested medication (s) are due for refill today: yes  Requested medication (s) are on the active medication list: yes  Last refill: 11/02/2018  Future visit scheduled: yes  Notes to clinic: review for refill   Requested Prescriptions  Pending Prescriptions Disp Refills   amLODipine (NORVASC) 5 MG tablet [Pharmacy Med Name: AMLODIPINE BESYLATE 5MG  TABLETS] 90 tablet 1    Sig: TAKE 1 TABLET(5 MG) BY MOUTH DAILY     Cardiovascular:  Calcium Channel Blockers Failed - 01/28/2019  3:46 AM      Failed - Last BP in normal range    BP Readings from Last 1 Encounters:  07/12/18 140/82         Failed - Valid encounter within last 6 months    Recent Outpatient Visits          6 months ago Encounter for Medicare annual wellness exam   [facility] [place], Ranell Patrick, MD   1 year ago Nonspecific abnormal electrocardiogram (ECG) (EKG)   [facility] [place], Ranell Patrick, MD   2 years ago Erectile dysfunction, unspecified erectile dysfunction type   [facility] [place], PA-C   2 years ago Persistent cough for 3 weeks or longer   [facility] [place], PA-C   3 years ago Annual physical exam   [facility] [place], Loura Back, MD      Future Appointments            In 6 days Wendie Agreste, MD [facility] [place], [facility]

## 2019-02-03 ENCOUNTER — Ambulatory Visit: Payer: PPO | Admitting: Family Medicine

## 2019-02-10 ENCOUNTER — Other Ambulatory Visit: Payer: Self-pay | Admitting: Family Medicine

## 2019-02-10 DIAGNOSIS — N529 Male erectile dysfunction, unspecified: Secondary | ICD-10-CM

## 2019-02-10 NOTE — Telephone Encounter (Signed)
Requested medication (s) are due for refill today: yes  Requested medication (s) are on the active medication list: yes  Last refill:  01/19/2019  Future visit scheduled: yes  Notes to clinic:  Review for refill   Requested Prescriptions  Pending Prescriptions Disp Refills   sildenafil (REVATIO) 20 MG tablet [Pharmacy Med Name: SILDENAFIL 20 MG TABLET] 30 tablet 1    Sig: TAKE 1 TO 2 TABLETS BY MOUTH ONCE PRIOR TO SEXUAL ACTIVITY     Urology: Erectile Dysfunction Agents Failed - 02/10/2019  8:32 AM      Failed - Last BP in normal range    BP Readings from Last 1 Encounters:  07/12/18 140/82         Passed - Valid encounter within last 12 months    Recent Outpatient Visits          7 months ago Encounter for [employer] annual wellness exam   [facility] [place], Ranell Patrick, MD   1 year ago Nonspecific abnormal electrocardiogram (ECG) (EKG)   [facility] [place], Ranell Patrick, MD   2 years ago Erectile dysfunction, unspecified erectile dysfunction type   [facility] [place], PA-C   2 years ago Persistent cough for 3 weeks or longer   [facility] [place], PA-C   3 years ago Annual physical exam   [facility] [place], Loura Back, MD      Future Appointments            In 6 days Wendie Agreste, MD [facility] [place], [facility]

## 2019-02-16 ENCOUNTER — Ambulatory Visit: Payer: PPO | Admitting: Family Medicine

## 2019-03-01 ENCOUNTER — Other Ambulatory Visit: Payer: Self-pay | Admitting: Cardiology

## 2019-03-01 DIAGNOSIS — I10 Essential (primary) hypertension: Secondary | ICD-10-CM

## 2019-03-02 ENCOUNTER — Ambulatory Visit: Payer: PPO | Admitting: Family Medicine

## 2019-03-10 ENCOUNTER — Ambulatory Visit: Payer: PPO | Admitting: Family Medicine

## 2019-04-01 ENCOUNTER — Other Ambulatory Visit: Payer: Self-pay | Admitting: Family Medicine

## 2019-04-01 DIAGNOSIS — N529 Male erectile dysfunction, unspecified: Secondary | ICD-10-CM

## 2019-04-01 NOTE — Telephone Encounter (Signed)
Requested medication (s) are due for refill today: yes  Requested medication (s) are on the active medication list: yes  Last refill:  03/10/2019  Future visit scheduled: no  Notes to clinic:  Review for refill Overdue for follow up   Requested Prescriptions  Pending Prescriptions Disp Refills   sildenafil (REVATIO) 20 MG tablet [Pharmacy Med Name: SILDENAFIL 20 MG TABLET] 30 tablet 1    Sig: TAKE 1 TO 2 TABLETS BY MOUTH ONCE PRIOR TO SEXUAL ACTIVITY     Urology: Erectile Dysfunction Agents Failed - 04/01/2019 11:30 AM      Failed - Last BP in normal range    BP Readings from Last 1 Encounters:  07/12/18 140/82         Passed - Valid encounter within last 12 months    Recent Outpatient Visits          8 months ago Encounter for Medicare annual wellness exam   [facility], Ranell Patrick, MD   2 years ago Nonspecific abnormal electrocardiogram (ECG) (EKG)   [facility], Ranell Patrick, MD   2 years ago Erectile dysfunction, unspecified erectile dysfunction type   [facility], PA-C   2 years ago Persistent cough for 3 weeks or longer   [facility], Audrie Lia, PA-C   3 years ago Annual physical exam   [facility], Loura Back, MD

## 2019-04-02 NOTE — Telephone Encounter (Signed)
Agree with OV need.

## 2019-06-08 DIAGNOSIS — L570 Actinic keratosis: Secondary | ICD-10-CM | POA: Diagnosis not present

## 2019-06-08 DIAGNOSIS — B078 Other viral warts: Secondary | ICD-10-CM | POA: Diagnosis not present

## 2019-06-08 DIAGNOSIS — L821 Other seborrheic keratosis: Secondary | ICD-10-CM | POA: Diagnosis not present

## 2019-06-08 DIAGNOSIS — D229 Melanocytic nevi, unspecified: Secondary | ICD-10-CM | POA: Diagnosis not present

## 2019-06-08 DIAGNOSIS — D485 Neoplasm of uncertain behavior of skin: Secondary | ICD-10-CM | POA: Diagnosis not present

## 2019-08-10 ENCOUNTER — Other Ambulatory Visit: Payer: Self-pay | Admitting: Family Medicine

## 2019-08-10 ENCOUNTER — Other Ambulatory Visit: Payer: Self-pay

## 2019-08-10 ENCOUNTER — Telehealth: Payer: Self-pay | Admitting: Family Medicine

## 2019-08-10 DIAGNOSIS — I10 Essential (primary) hypertension: Secondary | ICD-10-CM

## 2019-08-10 MED ORDER — AMLODIPINE BESYLATE 5 MG PO TABS: ORAL_TABLET | ORAL | 0 refills | Status: DC

## 2019-08-10 NOTE — Telephone Encounter (Signed)
Called Patient to try to set up a appointment.

## 2019-08-10 NOTE — Telephone Encounter (Signed)
Requested Prescriptions  Pending Prescriptions Disp Refills  . amLODipine (NORVASC) 5 MG tablet [Pharmacy Med Name: AMLODIPINE BESYLATE 5MG  TABLETS] 30 tablet 0    Sig: TAKE 1 TABLET(5 MG) BY MOUTH DAILY     Cardiovascular:  Calcium Channel Blockers Failed - 08/10/2019 10:05 AM      Failed - Last BP in normal range    BP Readings from Last 1 Encounters:  07/12/18 140/82         Failed - Valid encounter within last 6 months    Recent Outpatient Visits          1 year ago Encounter for Medicare annual wellness exam   [facility], Ranell Patrick, MD   2 years ago Nonspecific abnormal electrocardiogram (ECG) (EKG)   [facility], Ranell Patrick, MD   3 years ago Erectile dysfunction, unspecified erectile dysfunction type   [facility], PA-C   3 years ago Persistent cough for 3 weeks or longer   [facility], Audrie Lia, PA-C   3 years ago Annual physical exam   [facility], Loura Back, MD      Future Appointments            In 3 weeks Carlota Raspberry [facility], MD [facility] [place], [facility]

## 2019-08-10 NOTE — Telephone Encounter (Signed)
Requested medications are due for refill today?  Yes  Requested medications are on active medication list?  Yes  Last Refill:  01/28/2019   # 90 with one refill   Future visit scheduled?  No   Notes to Clinic:  Medication failed RX refill protocol due to no valid encounter in the last 6 months.  Patient has not been seen in a year.

## 2019-08-10 NOTE — Telephone Encounter (Signed)
What is the name of the medication? Brian (NORVASC) 5 MG tablet [id number] and Brian (ZESTORETIC) 20-25 MG tablet [id number]   Have you contacted your pharmacy to request a refill? Yes he needs an ov. He has an upcoming cpe scheduled with Dr. Carlota Raspberry on 08/31/19.  Which pharmacy would you like this sent to? Pharmacy  [facility] [place], [address]  [place], [place] [address]  Phone:  [phone] Fax:  [phone]       Patient notified that their request is being sent to the clinical staff for review and that they should receive a call once it is complete. If they do not receive a call within 72 hours they can check with their pharmacy or our office.

## 2019-08-19 ENCOUNTER — Telehealth: Payer: Self-pay | Admitting: Family Medicine

## 2019-08-19 NOTE — Telephone Encounter (Signed)
I called pt and left a vm for him to call back and make a lab visit for his cpe on 4/28

## 2019-08-27 ENCOUNTER — Other Ambulatory Visit: Payer: Self-pay | Admitting: Cardiology

## 2019-08-27 DIAGNOSIS — I10 Essential (primary) hypertension: Secondary | ICD-10-CM

## 2019-08-31 ENCOUNTER — Encounter: Payer: PPO | Admitting: Family Medicine

## 2019-09-07 ENCOUNTER — Other Ambulatory Visit: Payer: Self-pay | Admitting: Family Medicine

## 2019-09-07 DIAGNOSIS — I10 Essential (primary) hypertension: Secondary | ICD-10-CM

## 2019-09-09 ENCOUNTER — Other Ambulatory Visit: Payer: Self-pay | Admitting: Family Medicine

## 2019-09-09 DIAGNOSIS — I10 Essential (primary) hypertension: Secondary | ICD-10-CM

## 2019-09-29 ENCOUNTER — Ambulatory Visit (INDEPENDENT_AMBULATORY_CARE_PROVIDER_SITE_OTHER): Payer: PPO | Admitting: Family Medicine

## 2019-09-29 ENCOUNTER — Encounter: Payer: Self-pay | Admitting: Family Medicine

## 2019-09-29 ENCOUNTER — Other Ambulatory Visit: Payer: Self-pay

## 2019-09-29 VITALS — BP 150/86 | HR 82 | Temp 98.8°F | Ht 71.0 in | Wt 246.0 lb

## 2019-09-29 DIAGNOSIS — I10 Essential (primary) hypertension: Secondary | ICD-10-CM | POA: Diagnosis not present

## 2019-09-29 DIAGNOSIS — Z7289 Other problems related to lifestyle: Secondary | ICD-10-CM

## 2019-09-29 DIAGNOSIS — Z125 Encounter for screening for malignant neoplasm of prostate: Secondary | ICD-10-CM | POA: Diagnosis not present

## 2019-09-29 DIAGNOSIS — Z0001 Encounter for general adult medical examination with abnormal findings: Secondary | ICD-10-CM

## 2019-09-29 DIAGNOSIS — Z789 Other specified health status: Secondary | ICD-10-CM

## 2019-09-29 DIAGNOSIS — H9313 Tinnitus, bilateral: Secondary | ICD-10-CM

## 2019-09-29 DIAGNOSIS — H9190 Unspecified hearing loss, unspecified ear: Secondary | ICD-10-CM

## 2019-09-29 DIAGNOSIS — E669 Obesity, unspecified: Secondary | ICD-10-CM

## 2019-09-29 DIAGNOSIS — Z6834 Body mass index (BMI) 34.0-34.9, adult: Secondary | ICD-10-CM | POA: Diagnosis not present

## 2019-09-29 DIAGNOSIS — Z0000 Encounter for general adult medical examination without abnormal findings: Secondary | ICD-10-CM

## 2019-09-29 NOTE — Progress Notes (Signed)
Subjective:  Patient ID: Brian Mcguire, male    DOB: May 08, 1950  Age: 69 y.o. MRN: [id number]  CC:  Chief Complaint  Patient presents with  . Medicare Wellness    pt reports he feels great. pt would like to note that he once a year gets a knot in his R side of his back. pt dose a ham string strech and this seems to fix the problem.  . Referral    pt would like a in network referal to a hearing specilist for a hearing test.    HPI Brian Mcguire presents for  Wellness exam, right back issue, hearing concern. Care team: PCP, me Gastroenterology, Dr. Watt Climes Ophthalmology, Dr. Gershon Crane Dermatology, Dr. Denna Haggard   Back pain: Few times in past 10 years. Tight muscle in R low back - releases with hamstring stretch. Notes if not active. Resolves in few minutes.   Hearing difficulty: Last 5-6 years. Tinnitus both sides. Some decreased sounds at times. No recent changes.    Hypertension: Lisinopril HCTZ 20/25 mg daily, amlodipine 5 mg daily.  Last eval in March 2020. Planned 6 month follow up.  No missed doses.  Alcohol - wine, mixed drinks, beer. Some days none, on average 2-4 per day. But not every day. 5-6 days per week. Up to 4 per day. Rarely more - 5-6 once per month. Plans to decrease alcohol to help with weight loss.   Home readings: none.  BP Readings from Last 3 Encounters:  09/29/19 (!) 150/86  07/12/18 140/82  03/27/17 138/82   Lab Results  Component Value Date   CREATININE 1.11 07/12/2018   Immunization History  Administered Date(s) Administered  . Pneumococcal Conjugate-13 12/03/2015  covid vaccine - has not had.  Pneumovax, shingles - declines.    Cancer screening: Colon 11/26/15.  Prostate: requests PSA.  Lab Results  Component Value Date   PSA1 0.8 07/12/2018   PSA 0.28 12/03/2015   Lipid screen: Lab Results  Component Value Date   CHOL 147 12/03/2015   HDL 47 12/03/2015   LDLCALC 84 12/03/2015   TRIG 81 12/03/2015   CHOLHDL 3.1 12/03/2015      Obesity: Body mass index is 34.31 kg/m. Wt Readings from Last 3 Encounters:  09/29/19 246 lb (111.6 kg)  07/12/18 241 lb 9.6 oz (109.6 kg)  03/27/17 249 lb (112.9 kg)   Planning on cutting back on alcohol.  No recent exercise, plans to start.    Fall Risk  09/29/2019 07/12/2018 03/27/2017 03/23/2017 08/06/2016  Falls in the past year? 0 0 No No No  Number falls in past yr: - 0 - - -  Injury with Fall? - 0 - - -  Follow up Falls evaluation completed Falls evaluation completed - - -    Depression screen [facility] 2/9 09/29/2019 07/12/2018 03/27/2017 03/23/2017 08/06/2016  Decreased Interest 0 0 0 0 0  Down, Depressed, Hopeless 0 0 0 0 0  PHQ - 2 Score 0 0 0 0 0   Functional Status Survey: Is the patient deaf or have difficulty hearing?: No Does the patient have difficulty seeing, even when wearing glasses/contacts?: No Does the patient have difficulty concentrating, remembering, or making decisions?: No Does the patient have difficulty walking or climbing stairs?: No Does the patient have difficulty dressing or bathing?: No Does the patient have difficulty doing errands alone such as visiting a doctor's office or shopping?: No 6CIT Screen 09/29/2019 07/12/2018 03/23/2017  What Year? 0 points 0 points 0 points  What month? 0 points 0 points 0 points  What time? 0 points 0 points 0 points  Count back from 20 0 points 0 points 0 points  Months in reverse 0 points 0 points 0 points  Repeat phrase 0 points 0 points 2 points  Total Score 0 0 2      Office Visit from 09/29/2019 in [facility] at [facility]  AUDIT-C Score  7    Alcohol use: as above. Denies addiction or difficulty cutting back. No DUI's.   No exam data present optho - appt planned for cataract eval - Dr. Gershon Crane.   Dental - recent visit - has planned follow up- looking for new one in network.   Exercise - as above. Plans to start low intensity.   Advanced directives: Has living will, copy requested   History Patient  Active Problem List   Diagnosis Date Noted  . Substance dependency ([place]) 12/03/2015   History reviewed. No pertinent past medical history. Past Surgical History:  Procedure Laterality Date  . CHOLECYSTECTOMY    . COLON SURGERY     No Known Allergies Prior to Admission medications   Medication Sig Start Date End Date Taking? Authorizing Provider  amLODipine (NORVASC) 5 MG tablet TAKE 1 TABLET(5 MG) BY MOUTH DAILY 08/10/19  Yes Wendie Agreste, MD  lisinopril-hydrochlorothiazide (ZESTORETIC) 20-25 MG tablet TAKE 1 TABLET BY MOUTH DAILY 08/30/19  Yes Adrian Prows, MD  sildenafil (REVATIO) 20 MG tablet TAKE 1 TO 2 TABLETS BY MOUTH ONCE PRIOR TO SEXUAL ACTIVITY NEEDS OFFICE VISIT FOR FURTHER REFILLS 08/10/18  Yes Wendie Agreste, MD   Social History   Socioeconomic History  . Marital status: Divorced    Spouse name: Not on file  . Number of children: 2  . Years of education: Not on file  . Highest education level: Some college, no degree  Occupational History  . Not on file  Tobacco Use  . Smoking status: Never Smoker  . Smokeless tobacco: Never Used  Substance and Sexual Activity  . Alcohol use: Yes    Alcohol/week: 3.0 standard drinks    Types: 1 Cans of beer, 1 Glasses of wine, 1 Shots of liquor per week    Comment: pt states he has about 3 aday if he were to avrage it out along a week   . Drug use: No  . Sexual activity: Never  Other Topics Concern  . Not on file  Social History Narrative  . Not on file   Social Determinants of Health   Financial Resource Strain:   . Difficulty of Paying Living Expenses:   Food Insecurity:   . Worried About Charity fundraiser in the Last Year:   . Arboriculturist in the Last Year:   Transportation Needs:   . Film/video editor (Medical):   Marland Kitchen Lack of Transportation (Non-Medical):   Physical Activity:   . Days of Exercise per Week:   . Minutes of Exercise per Session:   Stress:   . Feeling of Stress :   Social Connections:   .  Frequency of Communication with Friends and Family:   . Frequency of Social Gatherings with Friends and Family:   . Attends Religious Services:   . Active Member of Clubs or Organizations:   . Attends Archivist Meetings:   Marland Kitchen Marital Status:   Intimate Partner Violence:   . Fear of Current or Ex-Partner:   . Emotionally Abused:   Marland Kitchen Physically Abused:   .  Sexually Abused:     Review of Systems  Constitutional: Negative for fatigue and unexpected weight change.  HENT: Positive for hearing loss and tinnitus.   Eyes: Negative for visual disturbance.  Respiratory: Negative for cough, chest tightness and shortness of breath.   Cardiovascular: Negative for chest pain, palpitations and leg swelling.  Gastrointestinal: Negative for abdominal pain and blood in stool.  Musculoskeletal: Positive for back pain (stiffness on R at times. ).  Neurological: Negative for dizziness, light-headedness and headaches.   13 point review of systems per patient health survey noted.  Negative other than as indicated above or in HPI.   Objective:   Vitals:   09/29/19 1428 09/29/19 1442  BP: (!) 180/91 (!) 150/86  Pulse: 82   Temp: 98.8 F (37.1 C)   TempSrc: Temporal   SpO2: 96%   Weight: 246 lb (111.6 kg)   Height: 5' 11"  (1.803 m)      Physical Exam Vitals reviewed.  Constitutional:      Appearance: He is well-developed. He is obese.  HENT:     Head: Normocephalic and atraumatic.     Right Ear: External ear normal.     Left Ear: External ear normal.  Eyes:     Conjunctiva/sclera: Conjunctivae normal.     Pupils: Pupils are equal, round, and reactive to light.  Neck:     Thyroid: No thyromegaly.  Cardiovascular:     Rate and Rhythm: Normal rate and regular rhythm.     Heart sounds: Normal heart sounds.  Pulmonary:     Effort: Pulmonary effort is normal. No respiratory distress.     Breath sounds: Normal breath sounds. No wheezing.  Abdominal:     General: There is no  distension.     Palpations: Abdomen is soft.     Tenderness: There is no abdominal tenderness.  Musculoskeletal:        General: No tenderness. Normal range of motion.     Cervical back: Normal range of motion and neck supple.  Lymphadenopathy:     Cervical: No cervical adenopathy.  Skin:    General: Skin is warm and dry.  Neurological:     Mental Status: He is alert and oriented to person, place, and time.     Deep Tendon Reflexes: Reflexes are normal and symmetric.  Psychiatric:        Behavior: Behavior normal.        Assessment & Plan:  Brian Mcguire is a 69 y.o. male . Medicare annual wellness visit, subsequent - Plan: Ambulatory referral to ENT  - - anticipatory guidance as below in AVS, screening labs if needed. Health maintenance items as above in HPI discussed/recommended as applicable.  - no concerning responses on depression, fall, or functional status screening. Any positive responses noted as above. Advanced directives discussed as in CHL.   Hearing difficulty, unspecified laterality - Plan: Ambulatory referral to ENT Tinnitus of both ears  -Possibly related to hearing loss, no recent acute changes.  Will refer to ENT for evaluation and likely hearing screen.  Essential hypertension - Plan: Comprehensive metabolic panel  -Slight decreased control.  Possible component of whitecoat syndrome, but he does plan on cutting back on alcohol, continue same regimen for now.  Recheck in 1 month.  Home monitoring discussed.  Asymptomatic  Alcohol use  -Discussed potential impact on hypertension.  He plans to cut back, recheck 1 month.  Class 1 obesity with body mass index (BMI) of 34.0 to 34.9 in adult,  unspecified obesity type, unspecified whether serious comorbidity present  -Low intensity exercise plan, start low, give slow principal discussed.  Screening for prostate cancer - Plan: PSA  -Check PSA.  He does plan on discussing some questions regarding testosterone at  his follow-up visit.   No orders of the defined types were placed in this encounter.  Patient Instructions    Start low intensity exercise and increase slowly - initially 15-20 min walking 4-5 days per week.  Gentle stretching afterwards.  Cutting back on alcohol will also help weight and blood pressure.   I will refer you to ENT for hearing and ringing in ears.  If any worsening or persistent back pain, return to discuss further.   recheck in 1 month.  No med changes for now, but if blood pressure still elevated may need to adjust medication.  Thank you for coming in today.  I do recommend covid vaccine. Let me know if there are further questions. COVID-19 Vaccine Information can be found at: [email or web address].co.uk For questions related to vaccine distribution or appointments, please email vaccine@[email or web address] .[email or web address] or call [phone])[phone].       Preventive Care 38 Years and Older, Male Preventive care refers to lifestyle choices and visits with your health care provider that can promote health and wellness. This includes:  A yearly physical exam. This is also called an annual well check.  Regular dental and eye exams.  Immunizations.  Screening for certain conditions.  Healthy lifestyle choices, such as diet and exercise. What can I expect for my preventive care visit? Physical exam Your health care provider will check:  Height and weight. These may be used to calculate body mass index (BMI), which is a measurement that tells if you are at a healthy weight.  Heart rate and blood pressure.  Your skin for abnormal spots. Counseling Your health care provider may ask you questions about:  Alcohol, tobacco, and drug use.  Emotional well-being.  Home and relationship well-being.  Sexual activity.  Eating habits.  History of falls.  Memory and ability to understand (cognition).  Work and work Statistician. What  immunizations do I need?  Influenza (flu) vaccine  This is recommended every year. Tetanus, diphtheria, and pertussis (Tdap) vaccine  You may need a Td booster every 10 years. Varicella (chickenpox) vaccine  You may need this vaccine if you have not already been vaccinated. Zoster (shingles) vaccine  You may need this after age 41. Pneumococcal conjugate (PCV13) vaccine  One dose is recommended after age 68. Pneumococcal polysaccharide (PPSV23) vaccine  One dose is recommended after age 80. Measles, mumps, and rubella (MMR) vaccine  You may need at least one dose of MMR if you were born in 1957 or later. You may also need a second dose. Meningococcal conjugate (MenACWY) vaccine  You may need this if you have certain conditions. Hepatitis A vaccine  You may need this if you have certain conditions or if you travel or work in places where you may be exposed to hepatitis A. Hepatitis B vaccine  You may need this if you have certain conditions or if you travel or work in places where you may be exposed to hepatitis B. Haemophilus influenzae type b (Hib) vaccine  You may need this if you have certain conditions. You may receive vaccines as individual doses or as more than one vaccine together in one shot (combination vaccines). Talk with your health care provider about the risks and benefits of combination vaccines. What tests do I need? Blood  tests  Lipid and cholesterol levels. These may be checked every 5 years, or more frequently depending on your overall health.  Hepatitis C test.  Hepatitis B test. Screening  Lung cancer screening. You may have this screening every year starting at age 38 if you have a 30-pack-year history of smoking and currently smoke or have quit within the past 15 years.  Colorectal cancer screening. All adults should have this screening starting at age 93 and continuing until age 43. Your health care provider may recommend screening at age 71 if  you are at increased risk. You will have tests every 1-10 years, depending on your results and the type of screening test.  Prostate cancer screening. Recommendations will vary depending on your family history and other risks.  Diabetes screening. This is done by checking your blood sugar (glucose) after you have not eaten for a while (fasting). You may have this done every 1-3 years.  Abdominal aortic aneurysm (AAA) screening. You may need this if you are a current or former smoker.  Sexually transmitted disease (STD) testing. Follow these instructions at home: Eating and drinking  Eat a diet that includes fresh fruits and vegetables, whole grains, lean protein, and low-fat dairy products. Limit your intake of foods with high amounts of sugar, saturated fats, and salt.  Take vitamin and mineral supplements as recommended by your health care provider.  Do not drink alcohol if your health care provider tells you not to drink.  If you drink alcohol: ? Limit how much you have to 0-2 drinks a day. ? Be aware of how much alcohol is in your drink. In the [place] one drink equals one 12 oz bottle of beer (355 mL), one 5 oz glass of wine (148 mL), or one 1 oz glass of hard liquor (44 mL). Lifestyle  Take daily care of your teeth and gums.  Stay active. Exercise for at least 30 minutes on 5 or more days each week.  Do not use any products that contain nicotine or tobacco, such as cigarettes, e-cigarettes, and chewing tobacco. If you need help quitting, ask your health care provider.  If you are sexually active, practice safe sex. Use a condom or other form of protection to prevent STIs (sexually transmitted infections).  Talk with your health care provider about taking a low-dose aspirin or statin. What's next?  Visit your health care provider once a year for a well check visit.  Ask your health care provider how often you should have your eyes and teeth checked.  Stay up to date on all  vaccines. This information is not intended to replace advice given to you by your health care provider. Make sure you discuss any questions you have with your health care provider. Document Revised: 04/15/2018 Document Reviewed: 04/15/2018 Elsevier Patient Education  [place].   Acute Back Pain, Adult Acute back pain is sudden and usually short-lived. It is often caused by an injury to the muscles and tissues in the back. The injury may result from:  A muscle or ligament getting overstretched or torn (strained). Ligaments are tissues that connect bones to each other. Lifting something improperly can cause a back strain.  Wear and tear (degeneration) of the spinal disks. Spinal disks are circular tissue that provides cushioning between the bones of the spine (vertebrae).  Twisting motions, such as while playing sports or doing yard work.  A hit to the back.  Arthritis. You may have a physical exam, lab tests, and  imaging tests to find the cause of your pain. Acute back pain usually goes away with rest and home care. Follow these instructions at home: Managing pain, stiffness, and swelling  Take over-the-counter and prescription medicines only as told by your health care provider.  Your health care provider may recommend applying ice during the first 24-48 hours after your pain starts. To do this: ? Put ice in a plastic bag. ? Place a towel between your skin and the bag. ? Leave the ice on for 20 minutes, 2-3 times a day.  If directed, apply heat to the affected area as often as told by your health care provider. Use the heat source that your health care provider recommends, such as a moist heat pack or a heating pad. ? Place a towel between your skin and the heat source. ? Leave the heat on for 20-30 minutes. ? Remove the heat if your skin turns bright red. This is especially important if you are unable to feel pain, heat, or cold. You have a greater risk of getting  burned. Activity   Do not stay in bed. Staying in bed for more than 1-2 days can delay your recovery.  Sit up and stand up straight. Avoid leaning forward when you sit, or hunching over when you stand. ? If you work at a desk, sit close to it so you do not need to lean over. Keep your chin tucked in. Keep your neck drawn back, and keep your elbows bent at a right angle. Your arms should look like the letter "L." ? Sit high and close to the steering wheel when you drive. Add lower back (lumbar) support to your car seat, if needed.  Take short walks on even surfaces as soon as you are able. Try to increase the length of time you walk each day.  Do not sit, drive, or stand in one place for more than 30 minutes at a time. Sitting or standing for long periods of time can put stress on your back.  Do not drive or use heavy machinery while taking prescription pain medicine.  Use proper lifting techniques. When you bend and lift, use positions that put less stress on your back: ? [place] your knees. ? Keep the load close to your body. ? Avoid twisting.  Exercise regularly as told by your health care provider. Exercising helps your back heal faster and helps prevent back injuries by keeping muscles strong and flexible.  Work with a physical therapist to make a safe exercise program, as recommended by your health care provider. Do any exercises as told by your physical therapist. Lifestyle  Maintain a healthy weight. Extra weight puts stress on your back and makes it difficult to have good posture.  Avoid activities or situations that make you feel anxious or stressed. Stress and anxiety increase muscle tension and can make back pain worse. Learn ways to manage anxiety and stress, such as through exercise. General instructions  Sleep on a firm mattress in a comfortable position. Try lying on your side with your knees slightly bent. If you lie on your back, put a pillow under your knees.  Follow  your treatment plan as told by your health care provider. This may include: ? Cognitive or behavioral therapy. ? Acupuncture or massage therapy. ? Meditation or yoga. Contact a health care provider if:  You have pain that is not relieved with rest or medicine.  You have increasing pain going down into your legs or buttocks.  Your pain does not improve after 2 weeks.  You have pain at night.  You lose weight without trying.  You have a fever or chills. Get help right away if:  You develop new bowel or bladder control problems.  You have unusual weakness or numbness in your arms or legs.  You develop nausea or vomiting.  You develop abdominal pain.  You feel faint. Summary  Acute back pain is sudden and usually short-lived.  Use proper lifting techniques. When you bend and lift, use positions that put less stress on your back.  Take over-the-counter and prescription medicines and apply heat or ice as directed by your health care provider. This information is not intended to replace advice given to you by your health care provider. Make sure you discuss any questions you have with your health care provider. Document Revised: 08/10/2018 Document Reviewed: 12/03/2016 Elsevier Patient Education  [place].   Tinnitus Tinnitus refers to hearing a sound when there is no actual source for that sound. This is often described as ringing in the ears. However, people with this condition may hear a variety of noises, in one ear or in both ears. The sounds of tinnitus can be soft, loud, or somewhere in between. Tinnitus can last for a few seconds or can be constant for days. It may go away without treatment and come back at various times. When tinnitus is constant or happens often, it can lead to other problems, such as trouble sleeping and trouble concentrating. Almost everyone experiences tinnitus at some point. Tinnitus that is long-lasting (chronic) or comes back often  (recurs) may require medical attention. What are the causes? The cause of tinnitus is often not known. In some cases, it can result from:  Exposure to loud noises from machinery, music, or other sources.  An object (foreign body) stuck in the ear.  Earwax buildup.  Drinking alcohol or caffeine.  Taking certain medicines.  Age-related hearing loss. It may also be caused by medical conditions such as:  Ear or sinus infections.  High blood pressure.  Heart diseases.  Anemia.  Allergies.  Meniere's disease.  Thyroid problems.  Tumors.  A weak, bulging blood vessel (aneurysm) near the ear. What are the signs or symptoms? The main symptom of tinnitus is hearing a sound when there is no source for that sound. It may sound like:  Buzzing.  Roaring.  Ringing.  Blowing air.  Hissing.  Whistling.  Sizzling.  Humming.  Running water.  A musical note.  Tapping. Symptoms may affect only one ear (unilateral) or both ears (bilateral). How is this diagnosed? Tinnitus is diagnosed based on your symptoms, your medical history, and a physical exam. Your health care provider may do a thorough hearing test (audiologic exam) if your tinnitus:  Is unilateral.  Causes hearing difficulties.  Lasts 6 months or longer. You may work with a health care provider who specializes in hearing disorders (audiologist). You may be asked questions about your symptoms and how they affect your daily life. You may have other tests done, such as:  CT scan.  MRI.  An imaging test of how blood flows through your blood vessels (angiogram). How is this treated? Treating an underlying medical condition can sometimes make tinnitus go away. If your tinnitus continues, other treatments may include:  Medicines.  Therapy and counseling to help you manage the stress of living with tinnitus.  Sound generators to mask the tinnitus. These include: ? Tabletop sound machines that play relaxing  sounds to help you fall asleep. ? Wearable devices that fit in your ear and play sounds or music. ? Acoustic neural stimulation. This involves using headphones to listen to music that contains an auditory signal. Over time, listening to this signal may change some pathways in your brain and make you less sensitive to tinnitus. This treatment is used for very severe cases when no other treatment is working.  Using hearing aids or cochlear implants if your tinnitus is related to hearing loss. Hearing aids are worn in the outer ear. Cochlear implants are surgically placed in the inner ear. Follow these instructions at home: Managing symptoms      When possible, avoid being in loud places and being exposed to loud sounds.  Wear hearing protection, such as earplugs, when you are exposed to loud noises.  Use a white noise machine, a humidifier, or other devices to mask the sound of tinnitus.  Practice techniques for reducing stress, such as meditation, yoga, or deep breathing. Work with your health care provider if you need help with managing stress.  Sleep with your head slightly raised. This may reduce the impact of tinnitus. General instructions  Do not use stimulants, such as nicotine, alcohol, or caffeine. Talk with your health care provider about other stimulants to avoid. Stimulants are substances that can make you feel alert and attentive by increasing certain activities in the body (such as heart rate and blood pressure). These substances may make tinnitus worse.  Take over-the-counter and prescription medicines only as told by your health care provider.  Try to get plenty of sleep each night.  Keep all follow-up visits as told by your health care provider. This is important. Contact a health care provider if:  Your tinnitus continues for 3 weeks or longer without stopping.  You develop sudden hearing loss.  Your symptoms get worse or do not get better with home care.  You  feel you are not able to manage the stress of living with tinnitus. Get help right away if:  You develop tinnitus after a head injury.  You have tinnitus along with any of the following: ? Dizziness. ? Loss of balance. ? Nausea and vomiting. ? Sudden, severe headache. These symptoms may represent a serious problem that is an emergency. Do not wait to see if the symptoms will go away. Get medical help right away. Call your local emergency services (911 in the [place] Do not drive yourself to the hospital. Summary  Tinnitus refers to hearing a sound when there is no actual source for that sound. This is often described as ringing in the ears.  Symptoms may affect only one ear (unilateral) or both ears (bilateral).  Use a white noise machine, a humidifier, or other devices to mask the sound of tinnitus.  Do not use stimulants, such as nicotine, alcohol, or caffeine. Talk with your health care provider about other stimulants to avoid. These substances may make tinnitus worse. This information is not intended to replace advice given to you by your health care provider. Make sure you discuss any questions you have with your health care provider. Document Revised: 11/03/2018 Document Reviewed: 01/29/2017 Elsevier Patient Education  [employer].   If you have lab work done today you will be contacted with your lab results within the next 2 weeks.  If you have not heard from [place] then please contact us. The fastest way to get your results is to register for My Chart.   IF you received  an x-ray today, you will receive an invoice from [facility]. Please contact [facility] at [phone] with questions or concerns regarding your invoice.   IF you received labwork today, you will receive an invoice from [place]. Please contact [facility] at [phone] with questions or concerns regarding your invoice.   Our billing staff will not be able to assist you with questions  regarding bills from these companies.  You will be contacted with the lab results as soon as they are available. The fastest way to get your results is to activate your My Chart account. Instructions are located on the last page of this paperwork. If you have not heard from [place] regarding the results in 2 weeks, please contact this office.          Signed, [name], MD [facility]

## 2019-09-29 NOTE — Patient Instructions (Addendum)
Start low intensity exercise and increase slowly - initially 15-20 min walking 4-5 days per week.  Gentle stretching afterwards.  Cutting back on alcohol will also help weight and blood pressure.   I will refer you to ENT for hearing and ringing in ears.  If any worsening or persistent back pain, return to discuss further.   recheck in 1 month.  No med changes for now, but if blood pressure still elevated may need to adjust medication.  Thank you for coming in today.  I do recommend covid vaccine. Let me know if there are further questions. COVID-19 Vaccine Information can be found at: [email or web address].co.uk For questions related to vaccine distribution or appointments, please email [email or web address] or call [phone].       Preventive Care 29 Years and Older, Male Preventive care refers to lifestyle choices and visits with your health care provider that can promote health and wellness. This includes:  A yearly physical exam. This is also called an annual well check.  Regular dental and eye exams.  Immunizations.  Screening for certain conditions.  Healthy lifestyle choices, such as diet and exercise. What can I expect for my preventive care visit? Physical exam Your health care provider will check:  Height and weight. These may be used to calculate body mass index (BMI), which is a measurement that tells if you are at a healthy weight.  Heart rate and blood pressure.  Your skin for abnormal spots. Counseling Your health care provider may ask you questions about:  Alcohol, tobacco, and drug use.  Emotional well-being.  Home and relationship well-being.  Sexual activity.  Eating habits.  History of falls.  Memory and ability to understand (cognition).  Work and work Statistician. What immunizations do I need?  Influenza (flu) vaccine  This is recommended every year. Tetanus, diphtheria, and  pertussis (Tdap) vaccine  You may need a Td booster every 10 years. Varicella (chickenpox) vaccine  You may need this vaccine if you have not already been vaccinated. Zoster (shingles) vaccine  You may need this after age 10. Pneumococcal conjugate (PCV13) vaccine  One dose is recommended after age 18. Pneumococcal polysaccharide (PPSV23) vaccine  One dose is recommended after age 18. Measles, mumps, and rubella (MMR) vaccine  You may need at least one dose of MMR if you were born in 1957 or later. You may also need a second dose. Meningococcal conjugate (MenACWY) vaccine  You may need this if you have certain conditions. Hepatitis A vaccine  You may need this if you have certain conditions or if you travel or work in places where you may be exposed to hepatitis A. Hepatitis B vaccine  You may need this if you have certain conditions or if you travel or work in places where you may be exposed to hepatitis B. Haemophilus influenzae type b (Hib) vaccine  You may need this if you have certain conditions. You may receive vaccines as individual doses or as more than one vaccine together in one shot (combination vaccines). Talk with your health care provider about the risks and benefits of combination vaccines. What tests do I need? Blood tests  Lipid and cholesterol levels. These may be checked every 5 years, or more frequently depending on your overall health.  Hepatitis C test.  Hepatitis B test. Screening  Lung cancer screening. You may have this screening every year starting at age 34 if you have a 30-pack-year history of smoking and currently smoke or have quit within the past 15 years.  Colorectal cancer screening. All adults should have this screening starting at age 18 and continuing until age 45. Your health care provider may recommend screening at age 34 if you are at increased risk. You will have tests every 1-10 years, depending on your results and the type of  screening test.  Prostate cancer screening. Recommendations will vary depending on your family history and other risks.  Diabetes screening. This is done by checking your blood sugar (glucose) after you have not eaten for a while (fasting). You may have this done every 1-3 years.  Abdominal aortic aneurysm (AAA) screening. You may need this if you are a current or former smoker.  Sexually transmitted disease (STD) testing. Follow these instructions at home: Eating and drinking  Eat a diet that includes fresh fruits and vegetables, whole grains, lean protein, and low-fat dairy products. Limit your intake of foods with high amounts of sugar, saturated fats, and salt.  Take vitamin and mineral supplements as recommended by your health care provider.  Do not drink alcohol if your health care provider tells you not to drink.  If you drink alcohol: ? Limit how much you have to 0-2 drinks a day. ? Be aware of how much alcohol is in your drink. In the [place] one drink equals one 12 oz bottle of beer (355 mL), one 5 oz glass of wine (148 mL), or one 1 oz glass of hard liquor (44 mL). Lifestyle  Take daily care of your teeth and gums.  Stay active. Exercise for at least 30 minutes on 5 or more days each week.  Do not use any products that contain nicotine or tobacco, such as cigarettes, e-cigarettes, and chewing tobacco. If you need help quitting, ask your health care provider.  If you are sexually active, practice safe sex. Use a condom or other form of protection to prevent STIs (sexually transmitted infections).  Talk with your health care provider about taking a low-dose aspirin or statin. What's next?  Visit your health care provider once a year for a well check visit.  Ask your health care provider how often you should have your eyes and teeth checked.  Stay up to date on all vaccines. This information is not intended to replace advice given to you by your health care provider.  Make sure you discuss any questions you have with your health care provider. Document Revised: 04/15/2018 Document Reviewed: 04/15/2018 Elsevier Patient Education  [employer].   Acute Back Pain, Adult Acute back pain is sudden and usually short-lived. It is often caused by an injury to the muscles and tissues in the back. The injury may result from:  A muscle or ligament getting overstretched or torn (strained). Ligaments are tissues that connect bones to each other. Lifting something improperly can cause a back strain.  Wear and tear (degeneration) of the spinal disks. Spinal disks are circular tissue that provides cushioning between the bones of the spine (vertebrae).  Twisting motions, such as while playing sports or doing yard work.  A hit to the back.  Arthritis. You may have a physical exam, lab tests, and imaging tests to find the cause of your pain. Acute back pain usually goes away with rest and home care. Follow these instructions at home: Managing pain, stiffness, and swelling  Take over-the-counter and prescription medicines only as told by your health care provider.  Your health care provider may recommend applying ice during the first 24-48 hours after your pain starts. To do this: ?  Put ice in a plastic bag. ? Place a towel between your skin and the bag. ? Leave the ice on for 20 minutes, 2-3 times a day.  If directed, apply heat to the affected area as often as told by your health care provider. Use the heat source that your health care provider recommends, such as a moist heat pack or a heating pad. ? Place a towel between your skin and the heat source. ? Leave the heat on for 20-30 minutes. ? Remove the heat if your skin turns bright red. This is especially important if you are unable to feel pain, heat, or cold. You have a greater risk of getting burned. Activity   Do not stay in bed. Staying in bed for more than 1-2 days can delay your recovery.  Sit  up and stand up straight. Avoid leaning forward when you sit, or hunching over when you stand. ? If you work at a desk, sit close to it so you do not need to lean over. Keep your chin tucked in. Keep your neck drawn back, and keep your elbows bent at a right angle. Your arms should look like the letter "L." ? Sit high and close to the steering wheel when you drive. Add lower back (lumbar) support to your car seat, if needed.  Take short walks on even surfaces as soon as you are able. Try to increase the length of time you walk each day.  Do not sit, drive, or stand in one place for more than 30 minutes at a time. Sitting or standing for long periods of time can put stress on your back.  Do not drive or use heavy machinery while taking prescription pain medicine.  Use proper lifting techniques. When you bend and lift, use positions that put less stress on your back: ? [place] your knees. ? Keep the load close to your body. ? Avoid twisting.  Exercise regularly as told by your health care provider. Exercising helps your back heal faster and helps prevent back injuries by keeping muscles strong and flexible.  Work with a physical therapist to make a safe exercise program, as recommended by your health care provider. Do any exercises as told by your physical therapist. Lifestyle  Maintain a healthy weight. Extra weight puts stress on your back and makes it difficult to have good posture.  Avoid activities or situations that make you feel anxious or stressed. Stress and anxiety increase muscle tension and can make back pain worse. Learn ways to manage anxiety and stress, such as through exercise. General instructions  Sleep on a firm mattress in a comfortable position. Try lying on your side with your knees slightly bent. If you lie on your back, put a pillow under your knees.  Follow your treatment plan as told by your health care provider. This may include: ? Cognitive or behavioral  therapy. ? Acupuncture or massage therapy. ? Meditation or yoga. Contact a health care provider if:  You have pain that is not relieved with rest or medicine.  You have increasing pain going down into your legs or buttocks.  Your pain does not improve after 2 weeks.  You have pain at night.  You lose weight without trying.  You have a fever or chills. Get help right away if:  You develop new bowel or bladder control problems.  You have unusual weakness or numbness in your arms or legs.  You develop nausea or vomiting.  You develop abdominal pain.  You feel faint. Summary  Acute back pain is sudden and usually short-lived.  Use proper lifting techniques. When you bend and lift, use positions that put less stress on your back.  Take over-the-counter and prescription medicines and apply heat or ice as directed by your health care provider. This information is not intended to replace advice given to you by your health care provider. Make sure you discuss any questions you have with your health care provider. Document Revised: 08/10/2018 Document Reviewed: 12/03/2016 Elsevier Patient Education  [place].   Tinnitus Tinnitus refers to hearing a sound when there is no actual source for that sound. This is often described as ringing in the ears. However, people with this condition may hear a variety of noises, in one ear or in both ears. The sounds of tinnitus can be soft, loud, or somewhere in between. Tinnitus can last for a few seconds or can be constant for days. It may go away without treatment and come back at various times. When tinnitus is constant or happens often, it can lead to other problems, such as trouble sleeping and trouble concentrating. Almost everyone experiences tinnitus at some point. Tinnitus that is long-lasting (chronic) or comes back often (recurs) may require medical attention. What are the causes? The cause of tinnitus is often not known. In  some cases, it can result from:  Exposure to loud noises from machinery, music, or other sources.  An object (foreign body) stuck in the ear.  Earwax buildup.  Drinking alcohol or caffeine.  Taking certain medicines.  Age-related hearing loss. It may also be caused by medical conditions such as:  Ear or sinus infections.  High blood pressure.  Heart diseases.  Anemia.  Allergies.  Meniere's disease.  Thyroid problems.  Tumors.  A weak, bulging blood vessel (aneurysm) near the ear. What are the signs or symptoms? The main symptom of tinnitus is hearing a sound when there is no source for that sound. It may sound like:  Buzzing.  Roaring.  Ringing.  Blowing air.  Hissing.  Whistling.  Sizzling.  Humming.  Running water.  A musical note.  Tapping. Symptoms may affect only one ear (unilateral) or both ears (bilateral). How is this diagnosed? Tinnitus is diagnosed based on your symptoms, your medical history, and a physical exam. Your health care provider may do a thorough hearing test (audiologic exam) if your tinnitus:  Is unilateral.  Causes hearing difficulties.  Lasts 6 months or longer. You may work with a health care provider who specializes in hearing disorders (audiologist). You may be asked questions about your symptoms and how they affect your daily life. You may have other tests done, such as:  CT scan.  MRI.  An imaging test of how blood flows through your blood vessels (angiogram). How is this treated? Treating an underlying medical condition can sometimes make tinnitus go away. If your tinnitus continues, other treatments may include:  Medicines.  Therapy and counseling to help you manage the stress of living with tinnitus.  Sound generators to mask the tinnitus. These include: ? Tabletop sound machines that play relaxing sounds to help you fall asleep. ? Wearable devices that fit in your ear and play sounds or  music. ? Acoustic neural stimulation. This involves using headphones to listen to music that contains an auditory signal. Over time, listening to this signal may change some pathways in your brain and make you less sensitive to tinnitus. This treatment is used for very severe cases  when no other treatment is working.  Using hearing aids or cochlear implants if your tinnitus is related to hearing loss. Hearing aids are worn in the outer ear. Cochlear implants are surgically placed in the inner ear. Follow these instructions at home: Managing symptoms      When possible, avoid being in loud places and being exposed to loud sounds.  Wear hearing protection, such as earplugs, when you are exposed to loud noises.  Use a white noise machine, a humidifier, or other devices to mask the sound of tinnitus.  Practice techniques for reducing stress, such as meditation, yoga, or deep breathing. Work with your health care provider if you need help with managing stress.  Sleep with your head slightly raised. This may reduce the impact of tinnitus. General instructions  Do not use stimulants, such as nicotine, alcohol, or caffeine. Talk with your health care provider about other stimulants to avoid. Stimulants are substances that can make you feel alert and attentive by increasing certain activities in the body (such as heart rate and blood pressure). These substances may make tinnitus worse.  Take over-the-counter and prescription medicines only as told by your health care provider.  Try to get plenty of sleep each night.  Keep all follow-up visits as told by your health care provider. This is important. Contact a health care provider if:  Your tinnitus continues for 3 weeks or longer without stopping.  You develop sudden hearing loss.  Your symptoms get worse or do not get better with home care.  You feel you are not able to manage the stress of living with tinnitus. Get help right away  if:  You develop tinnitus after a head injury.  You have tinnitus along with any of the following: ? Dizziness. ? Loss of balance. ? Nausea and vomiting. ? Sudden, severe headache. These symptoms may represent a serious problem that is an emergency. Do not wait to see if the symptoms will go away. Get medical help right away. Call your local emergency services (911 in the [place] Do not drive yourself to the hospital. Summary  Tinnitus refers to hearing a sound when there is no actual source for that sound. This is often described as ringing in the ears.  Symptoms may affect only one ear (unilateral) or both ears (bilateral).  Use a white noise machine, a humidifier, or other devices to mask the sound of tinnitus.  Do not use stimulants, such as nicotine, alcohol, or caffeine. Talk with your health care provider about other stimulants to avoid. These substances may make tinnitus worse. This information is not intended to replace advice given to you by your health care provider. Make sure you discuss any questions you have with your health care provider. Document Revised: 11/03/2018 Document Reviewed: 01/29/2017 Elsevier Patient Education  [employer].   If you have lab work done today you will be contacted with your lab results within the next 2 weeks.  If you have not heard from [place] then please contact us. The fastest way to get your results is to register for My Chart.   IF you received an x-ray today, you will receive an invoice from [facility]. Please contact [facility] at [phone] with questions or concerns regarding your invoice.   IF you received labwork today, you will receive an invoice from [place]. Please contact [facility] at [phone] with questions or concerns regarding your invoice.   Our billing staff will not be able to assist you with questions  regarding bills from these companies.  You will be contacted with the lab results as soon as  they are available. The fastest way to get your results is to activate your My Chart account. Instructions are located on the last page of this paperwork. If you have not heard from [place] regarding the results in 2 weeks, please contact this office.

## 2019-09-30 LAB — COMPREHENSIVE METABOLIC PANEL
ALT: 30 IU/L (ref 0–44)
AST: 28 IU/L (ref 0–40)
Albumin/Globulin Ratio: 1.9 (ref 1.2–2.2)
Albumin: 4.6 g/dL (ref 3.8–4.8)
Alkaline Phosphatase: 54 IU/L (ref 48–121)
BUN/Creatinine Ratio: 15 (ref 10–24)
BUN: 17 mg/dL (ref 8–27)
Bilirubin Total: 1 mg/dL (ref 0.0–1.2)
CO2: 26 mmol/L (ref 20–29)
Calcium: 9.8 mg/dL (ref 8.6–10.2)
Chloride: 95 mmol/L — ABNORMAL LOW (ref 96–106)
Creatinine, Ser: 1.1 mg/dL (ref 0.76–1.27)
GFR calc Af Amer: 79 mL/min/{1.73_m2} (ref >59)
GFR calc non Af Amer: 68 mL/min/{1.73_m2} (ref >59)
Globulin, Total: 2.4 g/dL (ref 1.5–4.5)
Glucose: 95 mg/dL (ref 65–99)
Potassium: 4 mmol/L (ref 3.5–5.2)
Sodium: 137 mmol/L (ref 134–144)
Total Protein: 7 g/dL (ref 6.0–8.5)

## 2019-09-30 LAB — PSA: Prostate Specific Ag, Serum: 0.3 ng/mL (ref 0.0–4.0)

## 2019-10-06 ENCOUNTER — Other Ambulatory Visit: Payer: Self-pay | Admitting: Family Medicine

## 2019-10-06 DIAGNOSIS — I10 Essential (primary) hypertension: Secondary | ICD-10-CM

## 2019-10-08 ENCOUNTER — Other Ambulatory Visit: Payer: Self-pay | Admitting: Family Medicine

## 2019-10-08 DIAGNOSIS — I10 Essential (primary) hypertension: Secondary | ICD-10-CM

## 2019-10-10 ENCOUNTER — Telehealth: Payer: Self-pay | Admitting: Family Medicine

## 2019-10-10 ENCOUNTER — Other Ambulatory Visit: Payer: Self-pay

## 2019-10-10 DIAGNOSIS — N529 Male erectile dysfunction, unspecified: Secondary | ICD-10-CM

## 2019-10-10 MED ORDER — SILDENAFIL CITRATE 20 MG PO TABS: 20.0000 mg | ORAL_TABLET | Freq: Every day | ORAL | 0 refills | Status: DC | PRN

## 2019-10-10 NOTE — Telephone Encounter (Signed)
Pt called and stated he was suppose to being getting these medications refilled when pt had his physical on 09/29/19. Pt stated he needs the Amlodipine more because pt is going out of town early tomorrow. Pt would like a call when this is sent in. [phone] Please advise.  amLODipine (NORVASC) 5 MG tablet [[id number]]   sildenafil (REVATIO) 20 MG tablet [[id number]]

## 2019-10-10 NOTE — Telephone Encounter (Signed)
Authorized amlodipine 5 mg # 43 w 1 R to patient  pharmacy  per last prescription.

## 2019-10-27 ENCOUNTER — Ambulatory Visit: Payer: PPO | Admitting: Family Medicine

## 2019-11-02 ENCOUNTER — Ambulatory Visit (INDEPENDENT_AMBULATORY_CARE_PROVIDER_SITE_OTHER): Payer: PPO | Admitting: Family Medicine

## 2019-11-02 ENCOUNTER — Encounter: Payer: Self-pay | Admitting: Family Medicine

## 2019-11-02 ENCOUNTER — Other Ambulatory Visit: Payer: Self-pay

## 2019-11-02 VITALS — BP 132/74 | HR 52 | Temp 98.2°F | Resp 15 | Ht 71.0 in | Wt 231.8 lb

## 2019-11-02 DIAGNOSIS — I10 Essential (primary) hypertension: Secondary | ICD-10-CM

## 2019-11-02 DIAGNOSIS — N529 Male erectile dysfunction, unspecified: Secondary | ICD-10-CM

## 2019-11-02 NOTE — Patient Instructions (Addendum)
Great work on the weight loss, diet changes and cutting back on alcohol.  Let me know how things go in the next 30 days, no med changes for now.      If you have lab work done today you will be contacted with your lab results within the next 2 weeks.  If you have not heard from [place] then please contact us. The fastest way to get your results is to register for My Chart.   IF you received an x-ray today, you will receive an invoice from [facility]. Please contact [facility] at [phone] with questions or concerns regarding your invoice.   IF you received labwork today, you will receive an invoice from [place]. Please contact [facility] at [phone] with questions or concerns regarding your invoice.   Our billing staff will not be able to assist you with questions regarding bills from these companies.  You will be contacted with the lab results as soon as they are available. The fastest way to get your results is to activate your My Chart account. Instructions are located on the last page of this paperwork. If you have not heard from [place] regarding the results in 2 weeks, please contact this office.

## 2019-11-02 NOTE — Progress Notes (Signed)
Subjective:  Patient ID: Brian Mcguire, male    DOB: 1950/10/16  Age: 69 y.o. MRN: [id number]  CC:  Chief Complaint  Patient presents with  . Hypertension    pt has been working on weight loss and cut back on alcohol to attemtp to improve these numbers, pt denies physical symptoms, denies side effects.     HPI [name] presents for   Hypertension: Elevated at last visit, plan for decreasing alcohol use.  Was drinking on average 2-4 drinks per day, 5 to 6 days/week.  On some occasions up to 4/day, rarely more at 5-6 once per month.  Continued on same regimen with amlodipine 5 mg daily, lisinopril HCTZ 20/25 mg daily. No missed doses.  Home readings: none.  Has lost weight - cut back on alcohol. Only occasional glass of wine - 2-3 days per week - 1 glass usually. no further vodka or beer. eating better, exercising 2 days per week. 15# weight loss intentional.   Wt Readings from Last 3 Encounters:  11/02/19 231 lb 12.8 oz (105.1 kg)  09/29/19 246 lb (111.6 kg)  07/12/18 241 lb 9.6 oz (109.6 kg)    BP Readings from Last 3 Encounters:  11/02/19 132/74  09/29/19 (!) 150/86  07/12/18 140/82   Lab Results  Component Value Date   CREATININE 1.10 09/29/2019   Question about testosterone: Exercising more, wondering about toning muscles.  1/2 sildenafil with intercourse 1-2 times per week, works well.    History Patient Active Problem List   Diagnosis Date Noted  . Substance dependency ([place]) 12/03/2015   No past medical history on file. Past Surgical History:  Procedure Laterality Date  . CHOLECYSTECTOMY    . COLON SURGERY     No Known Allergies Prior to Admission medications   Medication Sig Start Date End Date Taking? Authorizing Provider  amLODipine (NORVASC) 5 MG tablet TAKE 1 TABLET(5 MG) BY MOUTH DAILY 08/10/19  Yes Wendie Agreste, MD  lisinopril-hydrochlorothiazide (ZESTORETIC) 20-25 MG tablet TAKE 1 TABLET BY MOUTH DAILY 08/30/19  Yes Adrian Prows, MD    sildenafil (REVATIO) 20 MG tablet Take 1 tablet (20 mg total) by mouth daily as needed. 10/10/19  Yes Wendie Agreste, MD   Social History   Socioeconomic History  . Marital status: Divorced    Spouse name: Not on file  . Number of children: 2  . Years of education: Not on file  . Highest education level: Some college, no degree  Occupational History  . Not on file  Tobacco Use  . Smoking status: Never Smoker  . Smokeless tobacco: Never Used  Vaping Use  . Vaping Use: Never used  Substance and Sexual Activity  . Alcohol use: Yes    Alcohol/week: 3.0 standard drinks    Types: 1 Cans of beer, 1 Glasses of wine, 1 Shots of liquor per week    Comment: pt states he has about 3 aday if he were to avrage it out along a week   . Drug use: No  . Sexual activity: Never  Other Topics Concern  . Not on file  Social History Narrative  . Not on file   Social Determinants of Health   Financial Resource Strain:   . Difficulty of Paying Living Expenses:   Food Insecurity:   . Worried About Charity fundraiser in the Last Year:   . [place] in the Last Year:   Transportation Needs:   . Lack of  Transportation (Medical):   Marland Kitchen Lack of Transportation (Non-Medical):   Physical Activity:   . Days of Exercise per Week:   . Minutes of Exercise per Session:   Stress:   . Feeling of Stress :   Social Connections:   . Frequency of Communication with Friends and Family:   . Frequency of Social Gatherings with Friends and Family:   . Attends Religious Services:   . Active Member of Clubs or Organizations:   . Attends Archivist Meetings:   Marland Kitchen Marital Status:   Intimate Partner Violence:   . Fear of Current or Ex-Partner:   . Emotionally Abused:   Marland Kitchen Physically Abused:   . Sexually Abused:     Review of Systems  Constitutional: Negative for fatigue and unexpected weight change.  Eyes: Negative for visual disturbance.  Respiratory: Negative for cough, chest tightness and  shortness of breath.   Cardiovascular: Negative for chest pain, palpitations and leg swelling.  Gastrointestinal: Negative for abdominal pain and blood in stool.  Neurological: Negative for dizziness, light-headedness and headaches.     Objective:   Vitals:   11/02/19 1143 11/02/19 1149  BP: (!) 152/73 132/74  Pulse: (!) 52   Resp: 15   Temp: 98.2 F (36.8 C)   TempSrc: Temporal   SpO2: 98%   Weight: 231 lb 12.8 oz (105.1 kg)   Height: 5\' 11"  (1.803 m)      Physical Exam Vitals reviewed.  Constitutional:      Appearance: He is well-developed.  HENT:     Head: Normocephalic and atraumatic.  Eyes:     Pupils: Pupils are equal, round, and reactive to light.  Neck:     Vascular: No carotid bruit or JVD.  Cardiovascular:     Rate and Rhythm: Normal rate and regular rhythm.     Heart sounds: Normal heart sounds. No murmur heard.   Pulmonary:     Effort: Pulmonary effort is normal.     Breath sounds: Normal breath sounds. No rales.  Skin:    General: Skin is warm and dry.  Neurological:     Mental Status: He is alert and oriented to person, place, and time.        Assessment & Plan:  Brian Mcguire is a 69 y.o. male . Essential hypertension  -Impressive weight loss since last visit with his change in diet, activity and significant decreased alcohol.  Commended on his efforts.  Repeat blood pressure better in office.  We will continue same regimen.  Recheck 6 months.  He plans to give me an update on how the weight loss and diet changes are working in the next month.  Erectile dysfunction, unspecified erectile dysfunction type  -Current medication working well, he does plan on restarting more exercise, consider testosterone testing if difficulty with maintaining muscular mass.    No orders of the defined types were placed in this encounter.  Patient Instructions   Doristine Devoid work on the weight loss, diet changes and cutting back on alcohol.  Let me know how things  go in the next 30 days, no med changes for now.      If you have lab work done today you will be contacted with your lab results within the next 2 weeks.  If you have not heard from [place] then please contact us. The fastest way to get your results is to register for My Chart.   IF you received an x-ray today, you will receive an invoice  from [facility]. Please contact [facility] at [phone] with questions or concerns regarding your invoice.   IF you received labwork today, you will receive an invoice from [facility]. Please contact [facility] at [phone] with questions or concerns regarding your invoice.   Our billing staff will not be able to assist you with questions regarding bills from these companies.  You will be contacted with the lab results as soon as they are available. The fastest way to get your results is to activate your My Chart account. Instructions are located on the last page of this paperwork. If you have not heard from [place] regarding the results in 2 weeks, please contact this office.         Signed, [name], MD [facility]

## 2019-11-11 ENCOUNTER — Ambulatory Visit (INDEPENDENT_AMBULATORY_CARE_PROVIDER_SITE_OTHER): Payer: Self-pay | Admitting: Otolaryngology

## 2019-11-17 ENCOUNTER — Other Ambulatory Visit: Payer: Self-pay | Admitting: Family Medicine

## 2019-11-17 DIAGNOSIS — N529 Male erectile dysfunction, unspecified: Secondary | ICD-10-CM

## 2019-11-17 DIAGNOSIS — I10 Essential (primary) hypertension: Secondary | ICD-10-CM

## 2019-11-17 MED ORDER — LISINOPRIL-HYDROCHLOROTHIAZIDE 20-25 MG PO TABS: 1.0000 | ORAL_TABLET | Freq: Every day | ORAL | 1 refills | Status: DC

## 2019-11-17 NOTE — Telephone Encounter (Signed)
Spoke with pharmacy and patient is trying to fill the Lisinopril and amlodipine to early. They have refills on file. I left a vm for patient to contact office regarding medication if he has ran out or misplaced the medication .

## 2019-11-17 NOTE — Telephone Encounter (Signed)
Medication Refill - Medication: sildenafil (REVATIO) 20 MG tablet Needs to go to  [id number] [address], [address] Phone:  [phone]  Fax:  [phone]       amLODipine (NORVASC) 5 MG tablet   lisinopril-hydrochlorothiazide (ZESTORETIC) 20-25 MG tablet  These other two ned to go to  [facility], [address] Phone:  [phone]  Fax:  [phone]       Has the patient contacted their pharmacy? Yes.   (Agent: If no, request that the patient contact the pharmacy for the refill.) (Agent: If yes, when and what did the pharmacy advise?)  Preferred Pharmacy (with phone number or street name):  [id number] - [place], [place] Phone:  [phone]  Fax:  [phone]     And   [place], [place] [address] Phone:  [phone]  Fax:  [phone]       Agent: Please be advised that RX refills may take up to 3 business days. We ask that you follow-up with your pharmacy.

## 2019-11-18 ENCOUNTER — Telehealth: Payer: Self-pay | Admitting: Family Medicine

## 2019-11-18 DIAGNOSIS — N529 Male erectile dysfunction, unspecified: Secondary | ICD-10-CM

## 2019-11-18 MED ORDER — SILDENAFIL CITRATE 20 MG PO TABS: 20.0000 mg | ORAL_TABLET | Freq: Every day | ORAL | 2 refills | Status: DC | PRN

## 2019-11-18 NOTE — Telephone Encounter (Signed)
Recently discussed - refill ordered.

## 2019-11-18 NOTE — Telephone Encounter (Signed)
Pt called for his med Refills before the weeks  Please advice

## 2019-11-18 NOTE — Telephone Encounter (Signed)
Pt is calling back  Still looking for   Have you contacted your pharmacy to request a refill? y  sildenafil (REVATIO) 20 MG tablet [[id number]    Patient states [facility]  [facility] [address]   Patient notified that their request is being sent to the clinical staff for review and that they should receive a call once it is complete. If they do not receive a call within 72 hours they can check with their pharmacy or our office.

## 2019-11-18 NOTE — Telephone Encounter (Signed)
Patient is requesting a refill of the following medications: Requested Prescriptions    No prescriptions requested or ordered in this encounter    Date of patient request: 11/18/19 Last office visit: 11/02/19 Date of last refill: 10/10/19 Last refill amount: 30 Follow up time period per chart:

## 2019-11-21 ENCOUNTER — Ambulatory Visit (INDEPENDENT_AMBULATORY_CARE_PROVIDER_SITE_OTHER): Payer: Self-pay | Admitting: Otolaryngology

## 2019-11-22 ENCOUNTER — Other Ambulatory Visit: Payer: Self-pay

## 2019-11-22 DIAGNOSIS — N529 Male erectile dysfunction, unspecified: Secondary | ICD-10-CM

## 2019-11-22 MED ORDER — SILDENAFIL CITRATE 20 MG PO TABS: 20.0000 mg | ORAL_TABLET | Freq: Every day | ORAL | 2 refills | Status: DC | PRN

## 2020-01-19 ENCOUNTER — Other Ambulatory Visit: Payer: Self-pay | Admitting: Family Medicine

## 2020-01-19 DIAGNOSIS — N529 Male erectile dysfunction, unspecified: Secondary | ICD-10-CM

## 2020-01-28 ENCOUNTER — Other Ambulatory Visit: Payer: Self-pay | Admitting: Family Medicine

## 2020-01-28 DIAGNOSIS — I10 Essential (primary) hypertension: Secondary | ICD-10-CM

## 2020-01-28 NOTE — Telephone Encounter (Signed)
Requested Prescriptions  Pending Prescriptions Disp Refills   amLODipine (NORVASC) 5 MG tablet [Pharmacy Med Name: AMLODIPINE BESYLATE 5MG  TABLETS] 90 tablet 1    Sig: TAKE 1 TABLET BY MOUTH EVERY DAY     Cardiovascular:  Calcium Channel Blockers Passed - 01/28/2020  7:03 AM      Passed - Last BP in normal range    BP Readings from Last 1 Encounters:  11/02/19 132/74         Passed - Valid encounter within last 6 months    Recent Outpatient Visits          2 months ago Essential hypertension   [facility], Ranell Patrick, MD   4 months ago Medicare annual wellness visit, subsequent   [facility], Ranell Patrick, MD   1 year ago Encounter for Medicare annual wellness exam   [facility], Ranell Patrick, MD   2 years ago Nonspecific abnormal electrocardiogram (ECG) (EKG)   [facility] [place], MD   3 years ago Erectile dysfunction, unspecified erectile dysfunction type   [facility], PA-C

## 2020-03-27 DIAGNOSIS — H25813 Combined forms of age-related cataract, bilateral: Secondary | ICD-10-CM | POA: Diagnosis not present

## 2020-03-27 DIAGNOSIS — H5213 Myopia, bilateral: Secondary | ICD-10-CM | POA: Diagnosis not present

## 2020-03-27 DIAGNOSIS — H52203 Unspecified astigmatism, bilateral: Secondary | ICD-10-CM | POA: Diagnosis not present

## 2020-03-27 DIAGNOSIS — H524 Presbyopia: Secondary | ICD-10-CM | POA: Diagnosis not present

## 2020-04-18 ENCOUNTER — Other Ambulatory Visit: Payer: Self-pay | Admitting: Family Medicine

## 2020-04-18 DIAGNOSIS — N529 Male erectile dysfunction, unspecified: Secondary | ICD-10-CM

## 2020-04-27 ENCOUNTER — Other Ambulatory Visit: Payer: Self-pay | Admitting: Family Medicine

## 2020-04-27 DIAGNOSIS — I10 Essential (primary) hypertension: Secondary | ICD-10-CM

## 2020-04-29 NOTE — Telephone Encounter (Signed)
Requested medication (s) are due for refill today: yes  Requested medication (s) are on the active medication list: yes  Last refill:  11/17/19  Future visit scheduled: no  Notes to clinic:  overdue lab work, needs appt   Requested Prescriptions  Pending Prescriptions Disp Refills   lisinopril-hydrochlorothiazide (ZESTORETIC) 20-25 MG tablet [Pharmacy Med Name: LISINOPRIL-HCTZ 20/25MG  TABLETS] 90 tablet 1    Sig: Take 1 tablet by mouth daily.      Cardiovascular:  ACEI + Diuretic Combos Failed - 04/27/2020  7:00 AM      Failed - Na in normal range and within 180 days    Sodium  Date Value Ref Range Status  09/29/2019 137 134 - 144 mmol/L Final          Failed - K in normal range and within 180 days    Potassium  Date Value Ref Range Status  09/29/2019 4.0 3.5 - 5.2 mmol/L Final          Failed - Cr in normal range and within 180 days    Creat  Date Value Ref Range Status  12/03/2015 1.10 0.70 - 1.25 mg/dL Final    Comment:      For patients > or = 69 years of age: The upper reference limit for Creatinine is approximately 13% higher for people identified as African-American.      Creatinine, Ser  Date Value Ref Range Status  09/29/2019 1.10 0.76 - 1.27 mg/dL Final          Failed - Ca in normal range and within 180 days    Calcium  Date Value Ref Range Status  09/29/2019 9.8 8.6 - 10.2 mg/dL Final          Passed - Patient is not pregnant      Passed - Last BP in normal range    BP Readings from Last 1 Encounters:  11/02/19 132/74          Passed - Valid encounter within last 6 months    Recent Outpatient Visits           5 months ago Essential hypertension   [facility], Ranell Patrick, MD   7 months ago Medicare annual wellness visit, subsequent   [facility], Ranell Patrick, MD   1 year ago Encounter for Medicare annual wellness exam   [facility], Ranell Patrick, MD   3 years ago Nonspecific abnormal  electrocardiogram (ECG) (EKG)   [facility] [place], MD   3 years ago Erectile dysfunction, unspecified erectile dysfunction type   [facility] [place], PA-C

## 2020-07-03 ENCOUNTER — Emergency Department (HOSPITAL_COMMUNITY): Payer: PPO

## 2020-07-03 ENCOUNTER — Other Ambulatory Visit: Payer: Self-pay

## 2020-07-03 ENCOUNTER — Emergency Department (HOSPITAL_COMMUNITY)
Admission: EM | Admit: 2020-07-03 | Discharge: 2020-07-04 | Disposition: A | Discharge status: Home / Self Care | Payer: PPO | Attending: Emergency Medicine | Admitting: Emergency Medicine

## 2020-07-03 ENCOUNTER — Encounter (HOSPITAL_COMMUNITY): Payer: Self-pay | Admitting: Emergency Medicine

## 2020-07-03 DIAGNOSIS — S199XXA Unspecified injury of neck, initial encounter: Secondary | ICD-10-CM | POA: Diagnosis not present

## 2020-07-03 DIAGNOSIS — S022XXA Fracture of nasal bones, initial encounter for closed fracture: Secondary | ICD-10-CM | POA: Diagnosis not present

## 2020-07-03 DIAGNOSIS — S0502XA Injury of conjunctiva and corneal abrasion without foreign body, left eye, initial encounter: Secondary | ICD-10-CM

## 2020-07-03 DIAGNOSIS — S0121XA Laceration without foreign body of nose, initial encounter: Secondary | ICD-10-CM | POA: Diagnosis not present

## 2020-07-03 DIAGNOSIS — W19XXXA Unspecified fall, initial encounter: Secondary | ICD-10-CM

## 2020-07-03 DIAGNOSIS — S0181XA Laceration without foreign body of other part of head, initial encounter: Secondary | ICD-10-CM | POA: Insufficient documentation

## 2020-07-03 DIAGNOSIS — S0992XA Unspecified injury of nose, initial encounter: Secondary | ICD-10-CM | POA: Diagnosis present

## 2020-07-03 DIAGNOSIS — Y9301 Activity, walking, marching and hiking: Secondary | ICD-10-CM | POA: Diagnosis not present

## 2020-07-03 DIAGNOSIS — S0990XA Unspecified injury of head, initial encounter: Secondary | ICD-10-CM | POA: Diagnosis not present

## 2020-07-03 DIAGNOSIS — S022XXB Fracture of nasal bones, initial encounter for open fracture: Secondary | ICD-10-CM | POA: Insufficient documentation

## 2020-07-03 DIAGNOSIS — Z23 Encounter for immunization: Secondary | ICD-10-CM | POA: Diagnosis not present

## 2020-07-03 DIAGNOSIS — W01198A Fall on same level from slipping, tripping and stumbling with subsequent striking against other object, initial encounter: Secondary | ICD-10-CM | POA: Insufficient documentation

## 2020-07-03 DIAGNOSIS — S60418A Abrasion of other finger, initial encounter: Secondary | ICD-10-CM | POA: Diagnosis not present

## 2020-07-03 DIAGNOSIS — S01112A Laceration without foreign body of left eyelid and periocular area, initial encounter: Secondary | ICD-10-CM | POA: Diagnosis not present

## 2020-07-03 DIAGNOSIS — S61412A Laceration without foreign body of left hand, initial encounter: Secondary | ICD-10-CM | POA: Diagnosis not present

## 2020-07-03 MED ORDER — LIDOCAINE-EPINEPHRINE 1 %-1:100000 IJ SOLN
20.0000 mL | Freq: Once | INTRAMUSCULAR | Status: AC
  Administered 2020-07-04: 20 mL
  Filled 2020-07-03: qty 20

## 2020-07-03 MED ORDER — POVIDONE-IODINE 5 % EX SOLN
Freq: Once | CUTANEOUS | Status: AC
  Filled 2020-07-03: qty 88.7

## 2020-07-03 MED ORDER — TETANUS-DIPHTH-ACELL PERTUSSIS 5-2.5-18.5 LF-MCG/0.5 IM SUSY
0.5000 mL | PREFILLED_SYRINGE | Freq: Once | INTRAMUSCULAR | Status: AC
  Administered 2020-07-03: 0.5 mL via INTRAMUSCULAR
  Filled 2020-07-03: qty 0.5

## 2020-07-03 MED ORDER — FLUORESCEIN SODIUM 1 MG OP STRP
1.0000 | ORAL_STRIP | Freq: Once | OPHTHALMIC | Status: AC
  Administered 2020-07-04: 1 via OPHTHALMIC
  Filled 2020-07-03: qty 1

## 2020-07-03 MED ORDER — TETRACAINE HCL 0.5 % OP SOLN
2.0000 [drp] | Freq: Once | OPHTHALMIC | Status: AC
  Administered 2020-07-04: 2 [drp] via OPHTHALMIC
  Filled 2020-07-03: qty 4

## 2020-07-03 NOTE — ED Notes (Signed)
Supplies on suture cart

## 2020-07-03 NOTE — ED Triage Notes (Addendum)
Pt states he tripped over a hose and hit his head on a pot. Pt has laceration to bridge of nose and left eyebrow, and scrap to top of head. Pt denies LOC or being on a blood thinner

## 2020-07-03 NOTE — ED Provider Notes (Addendum)
[facility] EMERGENCY DEPARTMENT Provider Note   CSN: [id number] Arrival date & time: 07/03/20  1932     History Chief Complaint  Patient presents with   Laceration    Brian Mcguire is a 69 y.o. male.  Patient here with facial trauma and injury after a fall.  States he was walking outside when he tripped over a piece of lawn equipment and hit his head on a metal pot and decorative statue.  He injured his top of the head, bridge of nose and left eye.  Did not lose consciousness.  Denies any blood thinner use.  Sustained abrasion to the scalp as well as laceration of the bridge of the nose and laceration to left eyebrow.  Does not wear glasses or contacts.  States his vision is normal.  Denies any preceding dizziness or lightheadedness.  No neck or back pain.  No chest pain or abdominal pain.  No trouble breathing.  No focal weakness, numbness or tingling.  The history is provided by the patient.  Laceration Associated symptoms: no fever        History reviewed. No pertinent past medical history.  Patient Active Problem List   Diagnosis Date Noted   Substance dependency ([place]) 12/03/2015    Past Surgical History:  Procedure Laterality Date   CHOLECYSTECTOMY     COLON SURGERY         Family History  Problem Relation Age of Onset   Cancer Father    Hypertension Father    Heart disease Father    Heart disease Maternal Grandfather     Social History   Tobacco Use   Smoking status: Never Smoker   Smokeless tobacco: Never Used  Vaping Use   Vaping Use: Never used  Substance Use Topics   Alcohol use: Yes    Alcohol/week: 3.0 standard drinks    Types: 1 Cans of beer, 1 Glasses of wine, 1 Shots of liquor per week    Comment: pt states he has about 3 aday if he were to avrage it out along a week    Drug use: No    Home Medications Prior to Admission medications   Medication Sig Start Date End Date Taking? Authorizing Provider  amLODipine (NORVASC) 5  MG tablet TAKE 1 TABLET BY MOUTH EVERY DAY 01/28/20  Yes Wendie Agreste, MD  lisinopril-hydrochlorothiazide (ZESTORETIC) 20-25 MG tablet TAKE 1 TABLET BY MOUTH DAILY 04/30/20  Yes Wendie Agreste, MD  amLODipine-atorvastatin (CADUET) 5-10 MG tablet Take 1 tablet by mouth daily. Patient not taking: Reported on 07/03/2020    [provider]  sildenafil (REVATIO) 20 MG tablet TAKE 1 TABLET BY MOUTH EVERY DAY AS NEEDED Patient not taking: Reported on 07/03/2020 04/18/20   Wendie Agreste, MD    Allergies    Patient has no known allergies.  Review of Systems   Review of Systems  Constitutional: Negative for activity change, appetite change, fatigue and fever.  HENT: Negative for congestion and rhinorrhea.   Eyes: Negative for visual disturbance.  Respiratory: Negative for cough, chest tightness and shortness of breath.   Cardiovascular: Negative for chest pain.  Gastrointestinal: Negative for abdominal distention, abdominal pain, nausea and vomiting.  Genitourinary: Negative for dysuria, frequency and genital sores.  Musculoskeletal: Negative for neck pain.  Neurological: Positive for weakness. Negative for dizziness, light-headedness and numbness.   all other systems are negative except as noted in the HPI and PMH.    Physical Exam Updated Vital Signs BP 134/77  Pulse 73    Temp 98.4 F (36.9 C)    Resp 18    Ht 5\' 11"  (1.803 m)    Wt 105.1 kg    SpO2 97%    BMI 32.32 kg/m   Physical Exam Vitals and nursing note reviewed.  Constitutional:      General: He is not in acute distress.    Appearance: He is well-developed and well-nourished.  HENT:     Head: Normocephalic and atraumatic.      Nose:     Comments: No septal hematoma or hemotympanum.  2 cm laceration to bridge of nose.  Second laceration to left lateral temple approx 3 cm not involving canthus.  Extraocular movements are intact.    Mouth/Throat:     Mouth: Oropharynx is clear and moist.     Pharynx: No  oropharyngeal exudate.  Eyes:     Extraocular Movements: Extraocular movements intact and EOM normal.     Left eye: Normal extraocular motion.     Conjunctiva/sclera:     Left eye: Left conjunctiva is injected. Chemosis present.     Pupils: Pupils are equal, round, and reactive to light.      Comments: Left periorbital ecchymosis and edema.  Extraocular movements are intact Injection and chemosis of left sclera  Areas of corneal abrasion as above.  No hyphema.  Seidel sign negative  Neck:     Comments: No C-spine tenderness Cardiovascular:     Rate and Rhythm: Normal rate and regular rhythm.     Pulses: Intact distal pulses.     Heart sounds: Normal heart sounds. No murmur heard.   Pulmonary:     Effort: Pulmonary effort is normal. No respiratory distress.     Breath sounds: Normal breath sounds.  Abdominal:     Palpations: Abdomen is soft.     Tenderness: There is no abdominal tenderness. There is no guarding or rebound.  Musculoskeletal:        General: Tenderness present. No edema. Normal range of motion.     Cervical back: Normal range of motion and neck supple.     Comments: Abrasion left first metacarpal.  No T or L-spine tenderness.  Full range of motion hips without pain  Skin:    General: Skin is warm.  Neurological:     General: No focal deficit present.     Mental Status: He is alert and oriented to person, place, and time.     Cranial Nerves: No cranial nerve deficit.     Motor: No abnormal muscle tone.     Coordination: Coordination normal.     Comments:  5/5 strength throughout. CN 2-12 intact.Equal grip strength.   Psychiatric:        Mood and Affect: Mood and affect normal.        Behavior: Behavior normal.     ED Results / Procedures / Treatments   Labs (all labs ordered are listed, but only abnormal results are displayed) Labs Reviewed - No data to display  EKG None  Radiology CT Head Wo Contrast  Result Date: 07/04/2020 CLINICAL DATA:  Tripped  over post and hit head EXAM: CT CERVICAL SPINE WITHOUT CONTRAST TECHNIQUE: Multidetector CT imaging of the cervical spine was performed without intravenous contrast. Multiplanar CT image reconstructions were also generated. COMPARISON:  None. FINDINGS: Brain: No evidence of acute territorial infarction, hemorrhage, hydrocephalus,extra-axial collection or mass lesion/mass effect. There is dilatation the ventricles and sulci consistent with age-related atrophy. Low-attenuation changes in the deep  white matter consistent with small vessel ischemia. Vascular: No hyperdense vessel or unexpected calcification. Skull: The skull is intact.  No skull fracture seen. Sinuses/Orbits: The visualized paranasal sinuses and mastoid air cells are clear. The orbits and globes intact. Other: Left-sided periorbital soft tissue swelling and nasal bridge swelling is noted. A small preseptal soft tissue hematoma is seen. Face: Osseous: There is a nondisplaced left nasal bone fracture. No other acute fracture is identified. Orbits: No fracture identified. Unremarkable appearance of globes and orbits. Sinuses: The visualized paranasal sinuses and mastoid air cells are unremarkable. Soft tissues: Left periorbital and nasal bridge soft tissue swelling is seen. There is a small preseptal hematoma present. Limited intracranial: No acute findings. Cervical spine: Alignment: There is straightening of the normal cervical lordosis. Skull base and vertebrae: Visualized skull base is intact. No atlanto-occipital dissociation. The vertebral body heights are well maintained. No fracture or pathologic osseous lesion seen. Soft tissues and spinal canal: The visualized paraspinal soft tissues are unremarkable. No prevertebral soft tissue swelling is seen. The spinal canal is grossly unremarkable, no large epidural collection or significant canal narrowing. Disc levels: Cervical spine spondylosis is seen with mild disc height loss disc osteophyte complex  and uncovertebral osteophytes most notable at C5-C6 with moderate neural foraminal narrowing. Upper chest: The lung apices are clear. Thoracic inlet is within normal limits. Other: Carotid artery calcifications are seen. IMPRESSION: No acute intracranial abnormality. Findings consistent with mild age related atrophy and chronic small vessel ischemia Nondisplaced left nasal bone fracture with overlying soft tissue swelling and a preseptal hematoma. Left periorbital soft tissue swelling No acute fracture or malalignment of the spine. Electronically Signed   By: Prudencio Pair M.D.   On: 07/04/2020 01:23   CT Cervical Spine Wo Contrast  Result Date: 07/04/2020 CLINICAL DATA:  Tripped over post and hit head EXAM: CT CERVICAL SPINE WITHOUT CONTRAST TECHNIQUE: Multidetector CT imaging of the cervical spine was performed without intravenous contrast. Multiplanar CT image reconstructions were also generated. COMPARISON:  None. FINDINGS: Brain: No evidence of acute territorial infarction, hemorrhage, hydrocephalus,extra-axial collection or mass lesion/mass effect. There is dilatation the ventricles and sulci consistent with age-related atrophy. Low-attenuation changes in the deep white matter consistent with small vessel ischemia. Vascular: No hyperdense vessel or unexpected calcification. Skull: The skull is intact.  No skull fracture seen. Sinuses/Orbits: The visualized paranasal sinuses and mastoid air cells are clear. The orbits and globes intact. Other: Left-sided periorbital soft tissue swelling and nasal bridge swelling is noted. A small preseptal soft tissue hematoma is seen. Face: Osseous: There is a nondisplaced left nasal bone fracture. No other acute fracture is identified. Orbits: No fracture identified. Unremarkable appearance of globes and orbits. Sinuses: The visualized paranasal sinuses and mastoid air cells are unremarkable. Soft tissues: Left periorbital and nasal bridge soft tissue swelling is seen.  There is a small preseptal hematoma present. Limited intracranial: No acute findings. Cervical spine: Alignment: There is straightening of the normal cervical lordosis. Skull base and vertebrae: Visualized skull base is intact. No atlanto-occipital dissociation. The vertebral body heights are well maintained. No fracture or pathologic osseous lesion seen. Soft tissues and spinal canal: The visualized paraspinal soft tissues are unremarkable. No prevertebral soft tissue swelling is seen. The spinal canal is grossly unremarkable, no large epidural collection or significant canal narrowing. Disc levels: Cervical spine spondylosis is seen with mild disc height loss disc osteophyte complex and uncovertebral osteophytes most notable at C5-C6 with moderate neural foraminal narrowing. Upper chest: The lung apices  are clear. Thoracic inlet is within normal limits. Other: Carotid artery calcifications are seen. IMPRESSION: No acute intracranial abnormality. Findings consistent with mild age related atrophy and chronic small vessel ischemia Nondisplaced left nasal bone fracture with overlying soft tissue swelling and a preseptal hematoma. Left periorbital soft tissue swelling No acute fracture or malalignment of the spine. Electronically Signed   By: Prudencio Pair M.D.   On: 07/04/2020 01:23   DG Hand Complete Left  Result Date: 07/03/2020 CLINICAL DATA:  Left hand laceration EXAM: LEFT HAND - COMPLETE 3+ VIEW COMPARISON:  None. FINDINGS: There is no evidence of fracture or dislocation. There is no evidence of arthropathy or other focal bone abnormality. Soft tissues are unremarkable. IMPRESSION: Negative. Electronically Signed   By: Virgina Norfolk M.D.   On: 07/03/2020 23:50   CT Maxillofacial Wo Contrast  Result Date: 07/04/2020 CLINICAL DATA:  Tripped over post and hit head EXAM: CT CERVICAL SPINE WITHOUT CONTRAST TECHNIQUE: Multidetector CT imaging of the cervical spine was performed without intravenous contrast.  Multiplanar CT image reconstructions were also generated. COMPARISON:  None. FINDINGS: Brain: No evidence of acute territorial infarction, hemorrhage, hydrocephalus,extra-axial collection or mass lesion/mass effect. There is dilatation the ventricles and sulci consistent with age-related atrophy. Low-attenuation changes in the deep white matter consistent with small vessel ischemia. Vascular: No hyperdense vessel or unexpected calcification. Skull: The skull is intact.  No skull fracture seen. Sinuses/Orbits: The visualized paranasal sinuses and mastoid air cells are clear. The orbits and globes intact. Other: Left-sided periorbital soft tissue swelling and nasal bridge swelling is noted. A small preseptal soft tissue hematoma is seen. Face: Osseous: There is a nondisplaced left nasal bone fracture. No other acute fracture is identified. Orbits: No fracture identified. Unremarkable appearance of globes and orbits. Sinuses: The visualized paranasal sinuses and mastoid air cells are unremarkable. Soft tissues: Left periorbital and nasal bridge soft tissue swelling is seen. There is a small preseptal hematoma present. Limited intracranial: No acute findings. Cervical spine: Alignment: There is straightening of the normal cervical lordosis. Skull base and vertebrae: Visualized skull base is intact. No atlanto-occipital dissociation. The vertebral body heights are well maintained. No fracture or pathologic osseous lesion seen. Soft tissues and spinal canal: The visualized paraspinal soft tissues are unremarkable. No prevertebral soft tissue swelling is seen. The spinal canal is grossly unremarkable, no large epidural collection or significant canal narrowing. Disc levels: Cervical spine spondylosis is seen with mild disc height loss disc osteophyte complex and uncovertebral osteophytes most notable at C5-C6 with moderate neural foraminal narrowing. Upper chest: The lung apices are clear. Thoracic inlet is within normal  limits. Other: Carotid artery calcifications are seen. IMPRESSION: No acute intracranial abnormality. Findings consistent with mild age related atrophy and chronic small vessel ischemia Nondisplaced left nasal bone fracture with overlying soft tissue swelling and a preseptal hematoma. Left periorbital soft tissue swelling No acute fracture or malalignment of the spine. Electronically Signed   By: Prudencio Pair M.D.   On: 07/04/2020 01:23    Procedures .Marland KitchenLaceration Repair  Date/Time: 07/04/2020 12:43 AM Performed by: Ezequiel Essex, MD Authorized by: Ezequiel Essex, MD   Consent:    Consent obtained:  Verbal   Consent given by:  Patient   Risks, benefits, and alternatives were discussed: yes     Risks discussed:  Infection, need for additional repair, nerve damage, poor wound healing, pain, poor cosmetic result, vascular damage, retained foreign body and tendon damage   Alternatives discussed:  No treatment Universal protocol:  Procedure explained and questions answered to patient or proxy's satisfaction: yes     Relevant documents present and verified: yes     Test results available: yes     Imaging studies available: yes     Required blood products, implants, devices, and special equipment available: yes     Site/side marked: yes     Immediately prior to procedure, a time out was called: yes     Patient identity confirmed:  Verbally with patient Anesthesia:    Anesthesia method:  Local infiltration   Local anesthetic:  Lidocaine 1% WITH epi Laceration details:    Location:  Face   Face location:  L eyebrow   Length (cm):  3 Pre-procedure details:    Preparation:  Imaging obtained to evaluate for foreign bodies and patient was prepped and draped in usual sterile fashion Exploration:    Hemostasis achieved with:  Direct pressure and epinephrine   Wound exploration: wound explored through full range of motion and entire depth of wound visualized     Wound extent: no foreign  bodies/material noted, no muscle damage noted, no nerve damage noted and no tendon damage noted   Treatment:    Area cleansed with:  Povidone-iodine   Amount of cleaning:  Standard   Irrigation solution:  Sterile saline   Irrigation method:  Pressure wash   Visualized foreign bodies/material removed: no     Debridement:  None   Undermining:  None   Scar revision: no   Skin repair:    Repair method:  Sutures   Suture size:  6-0   Suture material:  Prolene   Suture technique:  Simple interrupted   Number of sutures:  5 Approximation:    Approximation:  Close Repair type:    Repair type:  Simple Post-procedure details:    Dressing:  Antibiotic ointment and adhesive bandage   Procedure completion:  Tolerated well, no immediate complications .Marland KitchenLaceration Repair  Date/Time: 07/04/2020 12:44 AM Performed by: Ezequiel Essex, MD Authorized by: Ezequiel Essex, MD   Consent:    Consent obtained:  Verbal   Consent given by:  Patient   Risks, benefits, and alternatives were discussed: yes     Risks discussed:  Infection, need for additional repair, nerve damage, poor wound healing, poor cosmetic result, pain, retained foreign body, tendon damage and vascular damage   Alternatives discussed:  No treatment Universal protocol:    Procedure explained and questions answered to patient or proxy's satisfaction: yes     Relevant documents present and verified: yes     Test results available: yes     Imaging studies available: yes     Required blood products, implants, devices, and special equipment available: yes     Site/side marked: yes     Immediately prior to procedure, a time out was called: yes     Patient identity confirmed:  Verbally with patient Anesthesia:    Anesthesia method:  Local infiltration   Local anesthetic:  Lidocaine 1% w/o epi Laceration details:    Location:  Face   Face location:  Nose   Length (cm):  2 Pre-procedure details:    Preparation:  Patient was  prepped and draped in usual sterile fashion and imaging obtained to evaluate for foreign bodies Exploration:    Limited defect created (wound extended): no     Hemostasis achieved with:  Direct pressure and epinephrine   Wound exploration: wound explored through full range of motion and entire depth of wound visualized  Wound extent: no nerve damage noted and no tendon damage noted   Treatment:    Area cleansed with:  Povidone-iodine   Amount of cleaning:  Standard   Irrigation solution:  Sterile saline   Irrigation method:  Pressure wash   Visualized foreign bodies/material removed: no     Debridement:  None   Undermining:  None   Scar revision: no   Skin repair:    Repair method:  Sutures   Suture size:  6-0   Suture material:  Prolene   Suture technique:  Simple interrupted   Number of sutures:  4 Approximation:    Approximation:  Close Repair type:    Repair type:  Simple Post-procedure details:    Dressing:  Antibiotic ointment and adhesive bandage   Procedure completion:  Tolerated well, no immediate complications     Medications Ordered in ED Medications  lidocaine-EPINEPHrine (XYLOCAINE W/EPI) 1 %-1:100000 (with pres) injection 20 mL (has no administration in time range)  fluorescein ophthalmic strip 1 strip (has no administration in time range)  tetracaine (PONTOCAINE) 0.5 % ophthalmic solution 2 drop (has no administration in time range)  Tdap (BOOSTRIX) injection 0.5 mL (0.5 mLs Intramuscular Given 07/03/20 2150)    ED Course  I have reviewed the triage vital signs and the nursing notes.  Pertinent labs & imaging results that were available during my care of the patient were reviewed by me and considered in my medical decision making (see chart for details).    MDM Rules/Calculators/A&P                         Fall with facial injury.  No loss of consciousness.  Laceration to bridge of nose and left lateral temple not involving the canthus.  Extraocular  limits are intact and vision is intact.  Tetanus updated.  With extensive ecchymosis of face will obtain imaging to rule out fracture  Lacerations repaired as above.  Patient also appears to have some corneal abrasion but no more serious globe injury  Imaging is remarkable for nasal bone fracture.  No orbital fracture.  No intracranial injury.  Patient treated with antibiotics as well as topical antibiotics for his corneal abrasion. Follow-up with PCP as well as ENT.  Suture removal in 5 to 7 days. Sleep with head of the bed elevated.  Sinus precautions.  Return precautions discussed Final Clinical Impression(s) / ED Diagnoses Final diagnoses:  Facial laceration, initial encounter  Fall, initial encounter  Open fracture of nasal bone, initial encounter  Abrasion of left cornea, initial encounter    Rx / DC Orders ED Discharge Orders    None       Breckin Zafar, Annie Main, MD 07/04/20 [id number]    Ezequiel Essex, MD 07/04/20 [phone]

## 2020-07-04 DIAGNOSIS — S0121XA Laceration without foreign body of nose, initial encounter: Secondary | ICD-10-CM | POA: Diagnosis not present

## 2020-07-04 DIAGNOSIS — S199XXA Unspecified injury of neck, initial encounter: Secondary | ICD-10-CM | POA: Diagnosis not present

## 2020-07-04 DIAGNOSIS — S022XXA Fracture of nasal bones, initial encounter for closed fracture: Secondary | ICD-10-CM | POA: Diagnosis not present

## 2020-07-04 DIAGNOSIS — S01112A Laceration without foreign body of left eyelid and periocular area, initial encounter: Secondary | ICD-10-CM | POA: Diagnosis not present

## 2020-07-04 DIAGNOSIS — S0990XA Unspecified injury of head, initial encounter: Secondary | ICD-10-CM | POA: Diagnosis not present

## 2020-07-04 DIAGNOSIS — S0502XA Injury of conjunctiva and corneal abrasion without foreign body, left eye, initial encounter: Secondary | ICD-10-CM | POA: Diagnosis not present

## 2020-07-04 MED ORDER — CEPHALEXIN 500 MG PO CAPS: 500.0000 mg | ORAL_CAPSULE | Freq: Three times a day (TID) | ORAL | 0 refills | Status: DC

## 2020-07-04 MED ORDER — POLYMYXIN B-TRIMETHOPRIM 10000-0.1 UNIT/ML-% OP SOLN: 1.0000 [drp] | Freq: Four times a day (QID) | OPHTHALMIC | 0 refills | Status: AC

## 2020-07-04 NOTE — Discharge Instructions (Signed)
Follow-up with your doctor for suture removal in 5 to 7 days.  You should sleep with the head of the bed elevated and do not blow your nose.  Take the antibiotics as prescribed and follow-up with the ear nose and throat doctor regarding your nasal fracture. Return to the ED with new or worsening symptoms

## 2020-07-10 DIAGNOSIS — S022XXA Fracture of nasal bones, initial encounter for closed fracture: Secondary | ICD-10-CM | POA: Diagnosis not present

## 2020-07-17 DIAGNOSIS — H1132 Conjunctival hemorrhage, left eye: Secondary | ICD-10-CM | POA: Diagnosis not present

## 2020-07-24 DIAGNOSIS — H903 Sensorineural hearing loss, bilateral: Secondary | ICD-10-CM | POA: Diagnosis not present

## 2020-07-26 ENCOUNTER — Other Ambulatory Visit: Payer: Self-pay | Admitting: Family Medicine

## 2020-07-26 DIAGNOSIS — I10 Essential (primary) hypertension: Secondary | ICD-10-CM

## 2020-07-26 NOTE — Telephone Encounter (Signed)
No further refills without office visit 

## 2020-07-26 NOTE — Telephone Encounter (Signed)
Requested medication (s) are due for refill today: yes  Requested medication (s) are on the active medication list: yes  Last refill:  07/17/20  Future visit scheduled: no  Notes to clinic:  no valid encounter within last 6 months    Requested Prescriptions  Pending Prescriptions Disp Refills   amLODipine (NORVASC) 5 MG tablet [Pharmacy Med Name: AMLODIPINE BESYLATE 5MG  TABLETS] 90 tablet 1    Sig: TAKE 1 TABLET BY MOUTH EVERY DAY      Cardiovascular:  Calcium Channel Blockers Failed - 07/26/2020  2:22 PM      Failed - Last BP in normal range    BP Readings from Last 1 Encounters:  07/04/20 (!) 156/91          Failed - Valid encounter within last 6 months    Recent Outpatient Visits           8 months ago Essential hypertension   [facility], MD   10 months ago Medicare annual wellness visit, subsequent   [facility], Ranell Patrick, MD   2 years ago Encounter for Medicare annual wellness exam   [facility], Ranell Patrick, MD   3 years ago Nonspecific abnormal electrocardiogram (ECG) (EKG)   [facility] [place], MD   3 years ago Erectile dysfunction, unspecified erectile dysfunction type   [facility], Audrie Lia, PA-C       Future Appointments             In 1 week Lavonna Monarch, MD [facility] [place]

## 2020-08-07 ENCOUNTER — Encounter: Payer: Self-pay | Admitting: Dermatology

## 2020-08-07 ENCOUNTER — Other Ambulatory Visit: Payer: Self-pay

## 2020-08-07 ENCOUNTER — Ambulatory Visit: Payer: PPO | Admitting: Dermatology

## 2020-08-07 DIAGNOSIS — Z1283 Encounter for screening for malignant neoplasm of skin: Secondary | ICD-10-CM | POA: Diagnosis not present

## 2020-08-07 DIAGNOSIS — D485 Neoplasm of uncertain behavior of skin: Secondary | ICD-10-CM

## 2020-08-07 DIAGNOSIS — L570 Actinic keratosis: Secondary | ICD-10-CM

## 2020-08-07 DIAGNOSIS — D0461 Carcinoma in situ of skin of right upper limb, including shoulder: Secondary | ICD-10-CM

## 2020-08-07 DIAGNOSIS — L729 Follicular cyst of the skin and subcutaneous tissue, unspecified: Secondary | ICD-10-CM | POA: Diagnosis not present

## 2020-08-07 NOTE — Patient Instructions (Signed)

## 2020-08-20 ENCOUNTER — Encounter: Payer: Self-pay | Admitting: Dermatology

## 2020-08-20 NOTE — Progress Notes (Signed)
   Follow-Up Visit   Subjective  Brian Mcguire is a 70 y.o. male who presents for the following: Annual Exam (NO REAL CONCERNS).  General skin check, new spot on right arm. Location:  Duration:  Quality:  Associated Signs/Symptoms: Modifying Factors:  Severity:  Timing: Context:   Objective  Well appearing patient in no apparent distress; mood and affect are within normal limits. 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  Probable skin cancer right arm will be biopsies and treated today.    All skin waist up examined.   Assessment & Plan    AK (actinic keratosis) (2) Left Forearm - Posterior; Scalp  Destruction of lesion - Left Forearm - Posterior, Scalp Complexity: simple   Destruction method: cryotherapy   Informed consent: discussed and consent obtained   Timeout:  patient name, date of birth, surgical site, and procedure verified Lesion destroyed using liquid nitrogen: Yes   Cryotherapy cycles:  5 Outcome: patient tolerated procedure well with no complications   Post-procedure details: wound care instructions given    Cyst of skin Right Chest  May leave if stable or choose to schedule future surgery.  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 Right Upper Forearm  Skin / nail biopsy Type of biopsy: tangential   Informed consent: discussed and consent obtained   Timeout: patient name, date of birth, surgical site, and procedure verified   Procedure prep:  Patient was prepped and draped in usual sterile fashion (Non sterile) Prep type:  Chlorhexidine Anesthesia: the lesion was anesthetized in a standard fashion   Anesthetic:  1%  lidocaine w/ epinephrine 1-100,000 local infiltration Instrument used: flexible razor blade   Hemostasis achieved with: ferric subsulfate   Outcome: patient tolerated procedure well   Post-procedure details: wound care instructions given    Destruction of lesion Complexity: simple   Destruction method: electrodesiccation and curettage   Informed consent: discussed and consent obtained   Timeout:  patient name, date of birth, surgical site, and procedure verified Anesthesia: the lesion was anesthetized in a standard fashion   Anesthetic:  1% lidocaine w/ epinephrine 1-100,000 local infiltration Curettage performed in three different directions: Yes   Electrodesiccation performed over the curetted area: Yes   Curettage cycles:  3 Lesion length (cm):  1.2 Lesion width (cm):  1.2 Margin per side (cm):  0 Final wound size (cm):  1.2 Hemostasis achieved with:  ferric subsulfate and electrodesiccation Outcome: patient tolerated procedure well with no complications   Post-procedure details: sterile dressing applied and wound care instructions given   Dressing type: bandage and petrolatum    Specimen 1 - Surgical pathology Differential Diagnosis: R/O BCC vs SCC - treated after biopsy  Check Margins: No  After shave biopsy the base was treated by triple curettage plus cautery.  Encounter for screening for malignant neoplasm of skin Mid Back  Annual skin examination.      I, Lavonna Monarch, MD, have reviewed all documentation for this visit.  The documentation on 08/20/20 for the exam, diagnosis, procedures, and orders are all accurate and complete.

## 2020-09-05 DIAGNOSIS — H90A32 Mixed conductive and sensorineural hearing loss, unilateral, left ear with restricted hearing on the contralateral side: Secondary | ICD-10-CM | POA: Diagnosis not present

## 2020-09-05 DIAGNOSIS — H6122 Impacted cerumen, left ear: Secondary | ICD-10-CM | POA: Diagnosis not present

## 2020-09-11 DIAGNOSIS — M25511 Pain in right shoulder: Secondary | ICD-10-CM | POA: Diagnosis not present

## 2020-09-11 DIAGNOSIS — M25562 Pain in left knee: Secondary | ICD-10-CM | POA: Diagnosis not present

## 2020-10-24 ENCOUNTER — Other Ambulatory Visit: Payer: Self-pay | Admitting: Family Medicine

## 2020-10-24 DIAGNOSIS — I10 Essential (primary) hypertension: Secondary | ICD-10-CM

## 2020-12-10 ENCOUNTER — Other Ambulatory Visit: Payer: Self-pay

## 2020-12-10 ENCOUNTER — Telehealth: Payer: Self-pay | Admitting: Family Medicine

## 2020-12-10 ENCOUNTER — Other Ambulatory Visit: Payer: Self-pay | Admitting: Family Medicine

## 2020-12-10 DIAGNOSIS — I10 Essential (primary) hypertension: Secondary | ICD-10-CM

## 2020-12-10 MED ORDER — LISINOPRIL-HYDROCHLOROTHIAZIDE 20-25 MG PO TABS: 1.0000 | ORAL_TABLET | Freq: Every day | ORAL | 0 refills | Status: DC

## 2020-12-10 NOTE — Telephone Encounter (Signed)
Medication sent to pharmacy  

## 2020-12-10 NOTE — Telephone Encounter (Signed)
Patient is going out of town and needs a refill of this medication until his next appt.  Please advise.

## 2020-12-24 ENCOUNTER — Ambulatory Visit: Payer: PPO | Admitting: Family Medicine

## 2021-01-09 ENCOUNTER — Other Ambulatory Visit: Payer: Self-pay

## 2021-01-09 ENCOUNTER — Ambulatory Visit (INDEPENDENT_AMBULATORY_CARE_PROVIDER_SITE_OTHER): Payer: PPO | Admitting: Family Medicine

## 2021-01-09 ENCOUNTER — Encounter: Payer: Self-pay | Admitting: Family Medicine

## 2021-01-09 VITALS — BP 142/90 | HR 76 | Temp 98.4°F | Resp 17 | Wt 243.4 lb

## 2021-01-09 DIAGNOSIS — Z13220 Encounter for screening for lipoid disorders: Secondary | ICD-10-CM

## 2021-01-09 DIAGNOSIS — I10 Essential (primary) hypertension: Secondary | ICD-10-CM

## 2021-01-09 DIAGNOSIS — N529 Male erectile dysfunction, unspecified: Secondary | ICD-10-CM

## 2021-01-09 MED ORDER — AMLODIPINE BESYLATE 5 MG PO TABS: 5.0000 mg | ORAL_TABLET | Freq: Every day | ORAL | 1 refills | Status: DC

## 2021-01-09 MED ORDER — LISINOPRIL-HYDROCHLOROTHIAZIDE 20-25 MG PO TABS: 1.0000 | ORAL_TABLET | Freq: Every day | ORAL | 1 refills | Status: DC

## 2021-01-09 MED ORDER — SILDENAFIL CITRATE 20 MG PO TABS
20.0000 mg | ORAL_TABLET | Freq: Every day | ORAL | 1 refills | Status: DC | PRN
Start: 2021-01-09 — End: 2021-06-06

## 2021-01-09 NOTE — Progress Notes (Signed)
Subjective:  Patient ID: Brian Mcguire, male    DOB: [birth year]  Age: 70 y.o. MRN: [id number]  CC:  Chief Complaint  Patient presents with   Hypertension    Everything has been going well    HPI Brian Mcguire presents for   Hypertension: Norvasc '5mg'$  qd, lisinopril hct -20/'25mg'$  qd.  Did not take caduet.  Does not want to take statin.  Last ate 3 hrs ago.  No new side effects. Has restarted exercise recently.  Wt Readings from Last 3 Encounters:  01/09/21 243 lb 6.4 oz (110.4 kg)  07/03/20 231 lb 11.3 oz (105.1 kg)  11/02/19 231 lb 12.8 oz (105.1 kg)     Home readings:none recent.  BP Readings from Last 3 Encounters:  01/09/21 (!) 142/90  07/04/20 (!) 156/91  11/02/19 132/74   Lab Results  Component Value Date   CREATININE 1.10 09/29/2019   Erectile dysfunction: No hearing or vision changes on meds  (1 pill). Works well. No CP or dyspnea with exertion.   Screening for Hyperlipidemia: No current statin. Last testing over 5 yrs ago.  Off exercise for awhile, back walking 1.5-72m per day in past week or so.  Lab Results  Component Value Date   CHOL 147 12/03/2015   HDL 47 12/03/2015   LDLCALC 84 12/03/2015   TRIG 81 12/03/2015   CHOLHDL 3.1 12/03/2015   Lab Results  Component Value Date   ALT 30 09/29/2019   AST 28 09/29/2019   ALKPHOS 54 09/29/2019   BILITOT 1.0 09/29/2019       History Patient Active Problem List   Diagnosis Date Noted   Substance dependency ([place] 12/03/2015   No past medical history on file. Past Surgical History:  Procedure Laterality Date   CHOLECYSTECTOMY     COLON SURGERY     No Known Allergies Prior to Admission medications   Medication Sig Start Date End Date Taking? Authorizing Provider  amLODipine (NORVASC) 5 MG tablet TAKE 1 TABLET BY MOUTH EVERY DAY 07/26/20  Yes GWendie Agreste MD  lisinopril-hydrochlorothiazide (ZESTORETIC) 20-25 MG tablet Take 1 tablet by mouth daily. 12/10/20  Yes GWendie Agreste MD   sildenafil (REVATIO) 20 MG tablet TAKE 1 TABLET BY MOUTH EVERY DAY AS NEEDED 04/18/20  Yes GWendie Agreste MD  amLODipine-atorvastatin (CADUET) 5-10 MG tablet Take 1 tablet by mouth daily. Patient not taking: Reported on 01/09/2021    [provider]  cephALEXin (KEFLEX) 500 MG capsule Take 1 capsule (500 mg total) by mouth 3 (three) times daily. Patient not taking: Reported on 01/09/2021 07/04/20   REzequiel Essex MD   Social History   Socioeconomic History   Marital status: Divorced    Spouse name: Not on file   Number of children: 2   Years of education: Not on file   Highest education level: Some college, no degree  Occupational History   Not on file  Tobacco Use   Smoking status: Never   Smokeless tobacco: Never  Vaping Use   Vaping Use: Never used  Substance and Sexual Activity   Alcohol use: Yes    Alcohol/week: 3.0 standard drinks    Types: 1 Cans of beer, 1 Glasses of wine, 1 Shots of liquor per week    Comment: pt states he has about 3 aday if he were to avrage it out along a week    Drug use: No   Sexual activity: Never  Other Topics Concern   Not on file  Social History Narrative   Not on file   Social Determinants of Health   Financial Resource Strain: Not on file  Food Insecurity: Not on file  Transportation Needs: Not on file  Physical Activity: Not on file  Stress: Not on file  Social Connections: Not on file  Intimate Partner Violence: Not on file    Review of Systems  Constitutional:  Negative for fatigue and unexpected weight change.  Eyes:  Negative for visual disturbance.  Respiratory:  Negative for cough, chest tightness and shortness of breath.   Cardiovascular:  Negative for chest pain, palpitations and leg swelling.  Gastrointestinal:  Negative for abdominal pain and blood in stool.  Neurological:  Negative for dizziness, light-headedness and headaches.    Objective:   Vitals:   01/09/21 1605  BP: (!) 142/90  Pulse: 76   Resp: 17  Temp: 98.4 F (36.9 C)  TempSrc: Temporal  SpO2: 98%  Weight: 243 lb 6.4 oz (110.4 kg)     Physical Exam Vitals reviewed.  Constitutional:      Appearance: He is well-developed.  HENT:     Head: Normocephalic and atraumatic.  Neck:     Vascular: No carotid bruit or JVD.  Cardiovascular:     Rate and Rhythm: Normal rate and regular rhythm.     Heart sounds: Normal heart sounds. No murmur heard. Pulmonary:     Effort: Pulmonary effort is normal.     Breath sounds: Normal breath sounds. No rales.  Musculoskeletal:     Right lower leg: No edema.     Left lower leg: No edema.  Skin:    General: Skin is warm and dry.  Neurological:     Mental Status: He is alert and oriented to person, place, and time.  Psychiatric:        Mood and Affect: Mood normal.    Assessment & Plan:  Brian Mcguire is a 70 y.o. male . Essential hypertension - Plan: Comprehensive metabolic panel, amLODipine (NORVASC) 5 MG tablet, lisinopril-hydrochlorothiazide (ZESTORETIC) 20-25 MG tablet  -Decreased control/borderline BP but has had some weight gain and now has made some lifestyle modifications, increased walking, goal of weight loss.  Home monitoring discussed, handout given on hypertension management and goals, RTC precautions if persistent elevated readings.  No med changes for now, plans for fasting labs in the next few days.  Screening for hyperlipidemia - Plan: Comprehensive metabolic panel, Lipid panel  -Fasting labs next few days  Erectile dysfunction, unspecified erectile dysfunction type - Plan: sildenafil (REVATIO) 20 MG tablet  - sildenafil Rx given - use lowest effective dose. Side effects discussed (including but not limited to headache/flushing, blue discoloration of vision, possible vascular steal and risk of cardiac effects if underlying unknown coronary artery disease, and permanent sensorineural hearing loss). Understanding expressed.   Meds ordered this encounter   Medications   sildenafil (REVATIO) 20 MG tablet    Sig: Take 1 tablet (20 mg total) by mouth daily as needed.    Dispense:  90 tablet    Refill:  1   amLODipine (NORVASC) 5 MG tablet    Sig: Take 1 tablet (5 mg total) by mouth daily.    Dispense:  90 tablet    Refill:  1   lisinopril-hydrochlorothiazide (ZESTORETIC) 20-25 MG tablet    Sig: Take 1 tablet by mouth daily.    Dispense:  90 tablet    Refill:  1   Patient Instructions  Blood pressure running a little high here.  Should improve with walking.  Keep a record of your blood pressures outside of the office and if remain over 140/90 - we will need to make changes. Let me know.    Managing Your Hypertension Hypertension, also called high blood pressure, is when the force of the blood pressing against the walls of the arteries is too strong. Arteries are blood vessels that carry blood from your heart throughout your body. Hypertension forces the heart to work harder to pump blood and may cause the arteries to become narrow or stiff. Understanding blood pressure readings Your personal target blood pressure may vary depending on your medical conditions, your age, and other factors. A blood pressure reading includes a higher number over a lower number. Ideally, your blood pressure should be below 120/80. You should know that: The first, or top, number is called the systolic pressure. It is a measure of the pressure in your arteries as your heart beats. The second, or bottom number, is called the diastolic pressure. It is a measure of the pressure in your arteries as the heart relaxes. Blood pressure is classified into four stages. Based on your blood pressure reading, your health care provider may use the following stages to determine what type of treatment you need, if any. Systolic pressure and diastolic pressure are measured in a unit called mmHg. Normal Systolic pressure: below 123456. Diastolic pressure: below 80. Elevated Systolic  pressure: [id number]. Diastolic pressure: below 80. Hypertension stage 1 Systolic pressure: 0000000. Diastolic pressure: [id number]. Hypertension stage 2 Systolic pressure: [id number] or above. Diastolic pressure: 90 or above. How can this condition affect me? Managing your hypertension is an important responsibility. Over time, hypertension can damage the arteries and decrease blood flow to important parts of the body, including the brain, heart, and kidneys. Having untreated or uncontrolled hypertension can lead to: A heart attack. A stroke. A weakened blood vessel (aneurysm). Heart failure. Kidney damage. Eye damage. Metabolic syndrome. Memory and concentration problems. Vascular dementia. What actions can I take to manage this condition? Hypertension can be managed by making lifestyle changes and possibly by taking medicines. Your health care provider will help you make a plan to bring your blood pressure within a normal range. Nutrition  Eat a diet that is high in fiber and potassium, and low in salt (sodium), added sugar, and fat. An example eating plan is called the Dietary Approaches to Stop Hypertension (DASH) diet. To eat this way: Eat plenty of fresh fruits and vegetables. Try to fill one-half of your plate at each meal with fruits and vegetables. Eat whole grains, such as whole-wheat pasta, brown rice, or whole-grain bread. Fill about one-fourth of your plate with whole grains. Eat low-fat dairy products. Avoid fatty cuts of meat, processed or cured meats, and poultry with skin. Fill about one-fourth of your plate with lean proteins such as fish, chicken without skin, beans, eggs, and tofu. Avoid pre-made and processed foods. These tend to be higher in sodium, added sugar, and fat. Reduce your daily sodium intake. Most people with hypertension should eat less than 1,500 mg of sodium a day. Lifestyle  Work with your health care provider to maintain a healthy body weight or to lose weight.  Ask what an ideal weight is for you. Get at least 30 minutes of exercise that causes your heart to beat faster (aerobic exercise) most days of the week. Activities may include walking, swimming, or biking. Include exercise to strengthen your muscles (resistance exercise), such as weight lifting, as part  of your weekly exercise routine. Try to do these types of exercises for 30 minutes at least 3 days a week. Do not use any products that contain nicotine or tobacco, such as cigarettes, e-cigarettes, and chewing tobacco. If you need help quitting, ask your health care provider. Control any long-term (chronic) conditions you have, such as high cholesterol or diabetes. Identify your sources of stress and find ways to manage stress. This may include meditation, deep breathing, or making time for fun activities. Alcohol use Do not drink alcohol if: Your health care provider tells you not to drink. You are pregnant, may be pregnant, or are planning to become pregnant. If you drink alcohol: Limit how much you use to: 0-1 drink a day for women. 0-2 drinks a day for men. Be aware of how much alcohol is in your drink. In the [place] one drink equals one 12 oz bottle of beer (355 mL), one 5 oz glass of wine (148 mL), or one 1 oz glass of hard liquor (44 mL). Medicines Your health care provider may prescribe medicine if lifestyle changes are not enough to get your blood pressure under control and if: Your systolic blood pressure is 130 or higher. Your diastolic blood pressure is 80 or higher. Take medicines only as told by your health care provider. Follow the directions carefully. Blood pressure medicines must be taken as told by your health care provider. The medicine does not work as well when you skip doses. Skipping doses also puts you at risk for problems. Monitoring Before you monitor your blood pressure: Do not smoke, drink caffeinated beverages, or exercise within 30 minutes before taking a  measurement. Use the bathroom and empty your bladder (urinate). Sit quietly for at least 5 minutes before taking measurements. Monitor your blood pressure at home as told by your health care provider. To do this: Sit with your back straight and supported. Place your feet flat on the floor. Do not cross your legs. Support your arm on a flat surface, such as a table. Make sure your upper arm is at heart level. Each time you measure, take two or three readings one minute apart and record the results. You may also need to have your blood pressure checked regularly by your health care provider. General information Talk with your health care provider about your diet, exercise habits, and other lifestyle factors that may be contributing to hypertension. Review all the medicines you take with your health care provider because there may be side effects or interactions. Keep all visits as told by your health care provider. Your health care provider can help you create and adjust your plan for managing your high blood pressure. Where to find more information National Heart, Lung, and Blood Institute: https://wilson-eaton.com/ American Heart Association: www.heart.org Contact a health care provider if: You think you are having a reaction to medicines you have taken. You have repeated (recurrent) headaches. You feel dizzy. You have swelling in your ankles. You have trouble with your vision. Get help right away if: You develop a severe headache or confusion. You have unusual weakness or numbness, or you feel faint. You have severe pain in your chest or abdomen. You vomit repeatedly. You have trouble breathing. These symptoms may represent a serious problem that is an emergency. Do not wait to see if the symptoms will go away. Get medical help right away. Call your local emergency services (911 in the [place] Do not drive yourself to the hospital. Summary Hypertension is when the  force of blood pumping through  your arteries is too strong. If this condition is not controlled, it may put you at risk for serious complications. Your personal target blood pressure may vary depending on your medical conditions, your age, and other factors. For most people, a normal blood pressure is less than 120/80. Hypertension is managed by lifestyle changes, medicines, or both. Lifestyle changes to help manage hypertension include losing weight, eating a healthy, low-sodium diet, exercising more, stopping smoking, and limiting alcohol. This information is not intended to replace advice given to you by your health care provider. Make sure you discuss any questions you have with your health care provider. Document Revised: 05/27/2019 Document Reviewed: 03/22/2019 Elsevier Patient Education  2022 [place],   [name], MD [place], [facility] 01/09/21 5:11 PM

## 2021-01-09 NOTE — Patient Instructions (Signed)
Blood pressure running a little high here. Should improve with walking.  Keep a record of your blood pressures outside of the office and if remain over 140/90 - we will need to make changes. Let me know.    Managing Your Hypertension Hypertension, also called high blood pressure, is when the force of the blood pressing against the walls of the arteries is too strong. Arteries are blood vessels that carry blood from your heart throughout your body. Hypertension forces the heart to work harder to pump blood and may cause the arteries to become narrow or stiff. Understanding blood pressure readings Your personal target blood pressure may vary depending on your medical conditions, your age, and other factors. A blood pressure reading includes a higher number over a lower number. Ideally, your blood pressure should be below 120/80. You should know that: The first, or top, number is called the systolic pressure. It is a measure of the pressure in your arteries as your heart beats. The second, or bottom number, is called the diastolic pressure. It is a measure of the pressure in your arteries as the heart relaxes. Blood pressure is classified into four stages. Based on your blood pressure reading, your health care provider may use the following stages to determine what type of treatment you need, if any. Systolic pressure and diastolic pressure are measured in a unit called mmHg. Normal Systolic pressure: below 123456. Diastolic pressure: below 80. Elevated Systolic pressure: [id number]. Diastolic pressure: below 80. Hypertension stage 1 Systolic pressure: 0000000. Diastolic pressure: [id number]. Hypertension stage 2 Systolic pressure: [id number] or above. Diastolic pressure: 90 or above. How can this condition affect me? Managing your hypertension is an important responsibility. Over time, hypertension can damage the arteries and decrease blood flow to important parts of the body, including the brain, heart, and  kidneys. Having untreated or uncontrolled hypertension can lead to: A heart attack. A stroke. A weakened blood vessel (aneurysm). Heart failure. Kidney damage. Eye damage. Metabolic syndrome. Memory and concentration problems. Vascular dementia. What actions can I take to manage this condition? Hypertension can be managed by making lifestyle changes and possibly by taking medicines. Your health care provider will help you make a plan to bring your blood pressure within a normal range. Nutrition  Eat a diet that is high in fiber and potassium, and low in salt (sodium), added sugar, and fat. An example eating plan is called the Dietary Approaches to Stop Hypertension (DASH) diet. To eat this way: Eat plenty of fresh fruits and vegetables. Try to fill one-half of your plate at each meal with fruits and vegetables. Eat whole grains, such as whole-wheat pasta, brown rice, or whole-grain bread. Fill about one-fourth of your plate with whole grains. Eat low-fat dairy products. Avoid fatty cuts of meat, processed or cured meats, and poultry with skin. Fill about one-fourth of your plate with lean proteins such as fish, chicken without skin, beans, eggs, and tofu. Avoid pre-made and processed foods. These tend to be higher in sodium, added sugar, and fat. Reduce your daily sodium intake. Most people with hypertension should eat less than 1,500 mg of sodium a day. Lifestyle  Work with your health care provider to maintain a healthy body weight or to lose weight. Ask what an ideal weight is for you. Get at least 30 minutes of exercise that causes your heart to beat faster (aerobic exercise) most days of the week. Activities may include walking, swimming, or biking. Include exercise to strengthen your muscles (resistance exercise),  such as weight lifting, as part of your weekly exercise routine. Try to do these types of exercises for 30 minutes at least 3 days a week. Do not use any products that  contain nicotine or tobacco, such as cigarettes, e-cigarettes, and chewing tobacco. If you need help quitting, ask your health care provider. Control any long-term (chronic) conditions you have, such as high cholesterol or diabetes. Identify your sources of stress and find ways to manage stress. This may include meditation, deep breathing, or making time for fun activities. Alcohol use Do not drink alcohol if: Your health care provider tells you not to drink. You are pregnant, may be pregnant, or are planning to become pregnant. If you drink alcohol: Limit how much you use to: 0-1 drink a day for women. 0-2 drinks a day for men. Be aware of how much alcohol is in your drink. In the [place] one drink equals one 12 oz bottle of beer (355 mL), one 5 oz glass of wine (148 mL), or one 1 oz glass of hard liquor (44 mL). Medicines Your health care provider may prescribe medicine if lifestyle changes are not enough to get your blood pressure under control and if: Your systolic blood pressure is 130 or higher. Your diastolic blood pressure is 80 or higher. Take medicines only as told by your health care provider. Follow the directions carefully. Blood pressure medicines must be taken as told by your health care provider. The medicine does not work as well when you skip doses. Skipping doses also puts you at risk for problems. Monitoring Before you monitor your blood pressure: Do not smoke, drink caffeinated beverages, or exercise within 30 minutes before taking a measurement. Use the bathroom and empty your bladder (urinate). Sit quietly for at least 5 minutes before taking measurements. Monitor your blood pressure at home as told by your health care provider. To do this: Sit with your back straight and supported. Place your feet flat on the floor. Do not cross your legs. Support your arm on a flat surface, such as a table. Make sure your upper arm is at heart level. Each time you measure, take two  or three readings one minute apart and record the results. You may also need to have your blood pressure checked regularly by your health care provider. General information Talk with your health care provider about your diet, exercise habits, and other lifestyle factors that may be contributing to hypertension. Review all the medicines you take with your health care provider because there may be side effects or interactions. Keep all visits as told by your health care provider. Your health care provider can help you create and adjust your plan for managing your high blood pressure. Where to find more information National Heart, Lung, and Blood Institute: https://wilson-eaton.com/ American Heart Association: www.heart.org Contact a health care provider if: You think you are having a reaction to medicines you have taken. You have repeated (recurrent) headaches. You feel dizzy. You have swelling in your ankles. You have trouble with your vision. Get help right away if: You develop a severe headache or confusion. You have unusual weakness or numbness, or you feel faint. You have severe pain in your chest or abdomen. You vomit repeatedly. You have trouble breathing. These symptoms may represent a serious problem that is an emergency. Do not wait to see if the symptoms will go away. Get medical help right away. Call your local emergency services (911 in the [place] Do not drive yourself to  the hospital. Summary Hypertension is when the force of blood pumping through your arteries is too strong. If this condition is not controlled, it may put you at risk for serious complications. Your personal target blood pressure may vary depending on your medical conditions, your age, and other factors. For most people, a normal blood pressure is less than 120/80. Hypertension is managed by lifestyle changes, medicines, or both. Lifestyle changes to help manage hypertension include losing weight, eating a healthy,  low-sodium diet, exercising more, stopping smoking, and limiting alcohol. This information is not intended to replace advice given to you by your health care provider. Make sure you discuss any questions you have with your health care provider. Document Revised: 05/27/2019 Document Reviewed: 03/22/2019 Elsevier Patient Education  2022 [employer].

## 2021-01-14 ENCOUNTER — Other Ambulatory Visit: Payer: Self-pay

## 2021-01-14 ENCOUNTER — Other Ambulatory Visit (INDEPENDENT_AMBULATORY_CARE_PROVIDER_SITE_OTHER): Payer: PPO

## 2021-01-14 DIAGNOSIS — I10 Essential (primary) hypertension: Secondary | ICD-10-CM | POA: Diagnosis not present

## 2021-01-14 DIAGNOSIS — Z13220 Encounter for screening for lipoid disorders: Secondary | ICD-10-CM | POA: Diagnosis not present

## 2021-01-14 LAB — LIPID PANEL
Cholesterol: 168 mg/dL (ref 0–200)
HDL: 37.2 mg/dL — ABNORMAL LOW (ref >39.00)
LDL Cholesterol: 113 mg/dL — ABNORMAL HIGH (ref 0–99)
NonHDL: 130.53
Total CHOL/HDL Ratio: 5
Triglycerides: 87 mg/dL (ref 0.0–149.0)
VLDL: 17.4 mg/dL (ref 0.0–40.0)

## 2021-01-14 LAB — COMPREHENSIVE METABOLIC PANEL
ALT: 15 U/L (ref 0–53)
AST: 15 U/L (ref 0–37)
Albumin: 4.3 g/dL (ref 3.5–5.2)
Alkaline Phosphatase: 44 U/L (ref 39–117)
BUN: 15 mg/dL (ref 6–23)
CO2: 29 mEq/L (ref 19–32)
Calcium: 9.6 mg/dL (ref 8.4–10.5)
Chloride: 98 mEq/L (ref 96–112)
Creatinine, Ser: 1.08 mg/dL (ref 0.40–1.50)
GFR: 69.58 mL/min (ref >60.00)
Glucose, Bld: 92 mg/dL (ref 70–99)
Potassium: 4.1 mEq/L (ref 3.5–5.1)
Sodium: 137 mEq/L (ref 135–145)
Total Bilirubin: 1.5 mg/dL — ABNORMAL HIGH (ref 0.2–1.2)
Total Protein: 6.7 g/dL (ref 6.0–8.3)

## 2021-01-16 ENCOUNTER — Telehealth: Payer: Self-pay

## 2021-01-16 NOTE — Telephone Encounter (Signed)
Pt is wanting some one to call him to go over on his blood work   Pt call back [phone]

## 2021-01-16 NOTE — Telephone Encounter (Signed)
Returned patient call and discussed lab results. Patient understood. No further concerns at this time.

## 2021-01-22 ENCOUNTER — Ambulatory Visit: Payer: PPO | Admitting: Dermatology

## 2021-01-22 ENCOUNTER — Encounter: Payer: Self-pay | Admitting: Dermatology

## 2021-01-22 ENCOUNTER — Other Ambulatory Visit: Payer: Self-pay

## 2021-01-22 DIAGNOSIS — L570 Actinic keratosis: Secondary | ICD-10-CM

## 2021-01-22 DIAGNOSIS — Z1283 Encounter for screening for malignant neoplasm of skin: Secondary | ICD-10-CM

## 2021-01-22 DIAGNOSIS — D485 Neoplasm of uncertain behavior of skin: Secondary | ICD-10-CM

## 2021-01-22 DIAGNOSIS — L820 Inflamed seborrheic keratosis: Secondary | ICD-10-CM | POA: Diagnosis not present

## 2021-01-22 MED ORDER — TOLAK 4 % EX CREA: TOPICAL_CREAM | CUTANEOUS | 1 refills | Status: DC

## 2021-01-26 ENCOUNTER — Other Ambulatory Visit: Payer: Self-pay | Admitting: Family Medicine

## 2021-01-26 DIAGNOSIS — R17 Unspecified jaundice: Secondary | ICD-10-CM

## 2021-01-26 NOTE — Progress Notes (Signed)
Hep

## 2021-01-28 ENCOUNTER — Other Ambulatory Visit: Payer: Self-pay

## 2021-01-28 DIAGNOSIS — Z13220 Encounter for screening for lipoid disorders: Secondary | ICD-10-CM

## 2021-01-28 DIAGNOSIS — R17 Unspecified jaundice: Secondary | ICD-10-CM

## 2021-02-06 ENCOUNTER — Encounter: Payer: Self-pay | Admitting: Dermatology

## 2021-02-06 NOTE — Progress Notes (Signed)
Follow-Up Visit   Subjective  Brian Mcguire is a 70 y.o. male who presents for the following: Annual Exam (Follow up scalp lesions that are crusty ).  Several persistent crusts scalp, ears, arms Location:  Duration:  Quality:  Associated Signs/Symptoms: Modifying Factors:  Severity:  Timing: Context:   Objective  Well appearing patient in no apparent distress; mood and affect are within normal limits. Left Postauricular Area Hornlike 4 mm gross       Mid Parietal Scalp Bilobed 7 mm gross       Left Forearm - Posterior, Right Forearm - Posterior 3 mm hornlike crusts on each forearm       All skin waist up examined.   Assessment & Plan    Neoplasm of uncertain behavior of skin (2) Left Postauricular Area  Skin / nail biopsy Type of biopsy: tangential   Informed consent: discussed and consent obtained   Timeout: patient name, date of birth, surgical site, and procedure verified   Procedure prep:  Patient was prepped and draped in usual sterile fashion (Non sterile) Prep type:  Chlorhexidine Anesthesia: the lesion was anesthetized in a standard fashion   Anesthetic:  1% lidocaine w/ epinephrine 1-100,000 local infiltration Instrument used: flexible razor blade   Outcome: patient tolerated procedure well   Post-procedure details: wound care instructions given    Destruction of lesion Complexity: simple   Destruction method: electrodesiccation and curettage   Informed consent: discussed and consent obtained   Timeout:  patient name, date of birth, surgical site, and procedure verified Anesthesia: the lesion was anesthetized in a standard fashion   Anesthetic:  1% lidocaine w/ epinephrine 1-100,000 local infiltration Curettage performed in three different directions: Yes   Curettage cycles:  1 Lesion length (cm):  0.8 Lesion width (cm):  0.8 Margin per side (cm):  0 Final wound size (cm):  0.8 Hemostasis achieved with:  aluminum  chloride Outcome: patient tolerated procedure well with no complications   Post-procedure details: wound care instructions given    Specimen 1 - Surgical pathology Differential Diagnosis: bcc vs scc- txpbx  Check Margins: No  Mid Parietal Scalp  Skin / nail biopsy Type of biopsy: tangential   Informed consent: discussed and consent obtained   Timeout: patient name, date of birth, surgical site, and procedure verified   Procedure prep:  Patient was prepped and draped in usual sterile fashion (Non sterile) Prep type:  Chlorhexidine Anesthesia: the lesion was anesthetized in a standard fashion   Anesthetic:  1% lidocaine w/ epinephrine 1-100,000 local infiltration Instrument used: flexible razor blade   Outcome: patient tolerated procedure well   Post-procedure details: wound care instructions given    Destruction of lesion Complexity: simple   Destruction method: electrodesiccation and curettage   Informed consent: discussed and consent obtained   Timeout:  patient name, date of birth, surgical site, and procedure verified Anesthesia: the lesion was anesthetized in a standard fashion   Anesthetic:  1% lidocaine w/ epinephrine 1-100,000 local infiltration Curettage performed in three different directions: Yes   Curettage cycles:  1 Lesion length (cm):  1 Lesion width (cm):  1 Margin per side (cm):  0 Final wound size (cm):  1 Hemostasis achieved with:  aluminum chloride Outcome: patient tolerated procedure well with no complications   Post-procedure details: wound care instructions given    Specimen 2 - Surgical pathology Differential Diagnosis: bcc vs scc- txpbx  Check Margins: No  After shave biopsy each lesion was treated by curettage plus  cautery.  AK (actinic keratosis) (2) Left Forearm - Posterior; Right Forearm - Posterior  Destruction of lesion - Left Forearm - Posterior, Right Forearm - Posterior Complexity: simple   Destruction method: cryotherapy    Informed consent: discussed and consent obtained   Timeout:  patient name, date of birth, surgical site, and procedure verified Lesion destroyed using liquid nitrogen: Yes   Cryotherapy cycles:  3 Outcome: patient tolerated procedure well with no complications    Related Medications Fluorouracil (TOLAK) 4 % CREA Apply to affected area qhs x 96-29 application      I, Lavonna Monarch, MD, have reviewed all documentation for this visit.  The documentation on 02/06/21 for the exam, diagnosis, procedures, and orders are all accurate and complete.

## 2021-02-21 ENCOUNTER — Encounter: Payer: Self-pay | Admitting: Family Medicine

## 2021-02-21 ENCOUNTER — Telehealth (INDEPENDENT_AMBULATORY_CARE_PROVIDER_SITE_OTHER): Payer: PPO | Admitting: Family Medicine

## 2021-02-21 VITALS — Temp 99.0°F

## 2021-02-21 DIAGNOSIS — R0981 Nasal congestion: Secondary | ICD-10-CM

## 2021-02-21 NOTE — Patient Instructions (Signed)
  HOME CARE TIPS:  -nasal saline twice daily  -Afrin nasal spray for 3 days  -COVID19 testing information: ForwardDrop.tn  Most pharmacies also offer testing and home test kits. If the Covid19 test is positive and you desire antiviral treatment, please contact a [facility] or schedule a follow up virtual visit through your primary care office or through the [employer].  Other test to treat options: ConnectRV.is?click_source=alert   -can use tylenol or aleve if needed for fevers, aches and pains per instructions  -stay hydrated, drink plenty of fluids and eat small healthy meals - avoid dairy  -can take 1000 IU (26mcg) Vit D3 and 100-500 mg of Vit C daily per instructions  -If the Covid test is positive, check out the [facility] website for more information on home care, transmission and treatment for COVID19  -follow up with your doctor in 2-3 days unless improving and feeling better  It was nice to meet you today, and I really hope you are feeling better soon. I help [facility] out with telemedicine visits on Tuesdays and Thursdays and am available for visits on those days. If you have any concerns or questions following this visit please schedule a follow up visit with your Primary Care doctor or seek care at a local urgent care clinic to avoid delays in care.    Seek in person care or schedule a follow up video visit promptly if your symptoms worsen, new concerns arise or you are not improving with treatment. Call 911 and/or seek emergency care if your symptoms are severe or life threatening.

## 2021-02-21 NOTE — Progress Notes (Signed)
Virtual Visit via Video Note  I connected with Micheal  on 02/21/21 at  3:40 PM EDT by a video enabled telemedicine application and verified that I am speaking with the correct person using two identifiers.  Location patient: home, [place] Location provider:work or home office Persons participating in the virtual visit: patient, provider  I discussed the limitations of evaluation and management by telemedicine and the availability of in person appointments. The patient expressed understanding and agreed to proceed.   HPI:  Acute telemedicine visit for sinus issues: -Onset: 4-5 days ago -Symptoms include: nasal congestion, ears are full, sneezing, mild cough -Denies: body aches, CP, SOB, inability to  -Pertinent past medical history: see below - HTN -Pertinent medication allergies: No Known Allergies -COVID-19 vaccine status: no vaccines, had covid once; no flu shots  ROS: See pertinent positives and negatives per HPI.  History reviewed. No pertinent past medical history.  Past Surgical History:  Procedure Laterality Date   CHOLECYSTECTOMY     COLON SURGERY       Current Outpatient Medications:    amLODipine (NORVASC) 5 MG tablet, Take 1 tablet (5 mg total) by mouth daily., Disp: 90 tablet, Rfl: 1   lisinopril-hydrochlorothiazide (ZESTORETIC) 20-25 MG tablet, Take 1 tablet by mouth daily., Disp: 90 tablet, Rfl: 1   sildenafil (REVATIO) 20 MG tablet, Take 1 tablet (20 mg total) by mouth daily as needed., Disp: 90 tablet, Rfl: 1  EXAM:  VITALS per patient if applicable:  GENERAL: alert, oriented, appears well and in no acute distress  HEENT: atraumatic, conjunttiva clear, no obvious abnormalities on inspection of external nose and ears  NECK: normal movements of the head and neck  LUNGS: on inspection no signs of respiratory distress, breathing rate appears normal, no obvious gross SOB, gasping or wheezing  CV: no obvious cyanosis  MS: moves all visible extremities without  noticeable abnormality  PSYCH/NEURO: pleasant and cooperative, no obvious depression or anxiety, speech and thought processing grossly intact  ASSESSMENT AND PLAN:  Discussed the following assessment and plan:  Nasal congestion  -we discussed possible serious and likely etiologies, options for evaluation and workup, limitations of telemedicine visit vs in person visit, treatment, treatment risks and precautions. Pt is agreeable to treatment via telemedicine at this moment.  Query viral upper respiratory illness, mild COVID-19 versus other.  Advised COVID19 testing.  Opted for nasal saline and short course of nasal decongestant. Advised to seek prompt in person care if worsening, new symptoms arise, or if is not improving with treatment. Discussed options for inperson care if PCP office not available.   I discussed the assessment and treatment plan with the patient. The patient was provided an opportunity to ask questions and all were answered. The patient agreed with the plan and demonstrated an understanding of the instructions.     Lucretia Kern, DO

## 2021-03-05 DEATH — deceased

## 2021-03-07 DIAGNOSIS — M25512 Pain in left shoulder: Secondary | ICD-10-CM | POA: Diagnosis not present

## 2021-03-15 ENCOUNTER — Other Ambulatory Visit: Payer: Self-pay

## 2021-03-15 ENCOUNTER — Telehealth: Payer: Self-pay

## 2021-03-15 DIAGNOSIS — Z125 Encounter for screening for malignant neoplasm of prostate: Secondary | ICD-10-CM

## 2021-03-15 NOTE — Telephone Encounter (Signed)
Because of the natural history of prostate cancer and ongoing controversy regarding screening and potential treatment outcomes of prostate cancer, there is some debate about when and if to check a PSA for prostate cancer screening. Additionally there is a chance of both false positive PSA and a false negative PSA.  This is a usually a test we discuss in person to weigh the risks and benefits before ordering that test. This is usually done at a physical as well. His last test in 2021 was normal and lower than prior readings. There are some recommendations to stop screening after age 60 if normal until that time. However I am fine with a lab only order for this test if he wants to check it. I am happy to discuss further with him as well if needed.  Thanks.

## 2021-03-15 NOTE — Telephone Encounter (Signed)
Caller name:Lisle Tamala Julian   On DPR? :no  Call back number:[phone]  Provider they see: Carlota Raspberry   Reason for call:Pt is calling he is wanting to know that he is wanting to know if the blood work from previous about prostate said he has not had a prostate exam and wondering how over do they check the PSA level

## 2021-03-15 NOTE — Telephone Encounter (Signed)
Returned patient call and spoke to him about his concerns. Patient has been scheduled to come in and labs repeated as pcp requested. Also I have added a PSA because patient seemed to be upset that one was not ordered at his last appt. He is wanting to know why one was not done then. Please advise

## 2021-03-15 NOTE — Telephone Encounter (Signed)
When I called  patient, I tried to explain to the patient these reasonings but he stated that his was a conversation that was spoke of at his last appt and so this is why I went ahead and added to his labs you already requested for him to repeat in 4 weeks. He is coming in on 12/1. Would you like for me to see if you have any openings that say and schedule him with you? Please advise

## 2021-03-15 NOTE — Telephone Encounter (Signed)
I called patient and discussed concerns. Brother with hx prostate CA. Discussed PSA limitations, recommendations, options.  We will proceed with PSA testing at his lab only visit December 1 when he will recheck other lab work as recommended from his September visit.  Orders are pending.  Has appointment with me December 22 and can discuss those results further at that time.  All questions were answered.

## 2021-03-29 DIAGNOSIS — M25512 Pain in left shoulder: Secondary | ICD-10-CM | POA: Diagnosis not present

## 2021-04-02 DIAGNOSIS — M25512 Pain in left shoulder: Secondary | ICD-10-CM | POA: Diagnosis not present

## 2021-04-04 ENCOUNTER — Other Ambulatory Visit: Payer: PPO

## 2021-04-08 ENCOUNTER — Ambulatory Visit: Payer: PPO | Admitting: Dermatology

## 2021-04-10 ENCOUNTER — Ambulatory Visit: Payer: PPO | Admitting: Family Medicine

## 2021-04-25 ENCOUNTER — Ambulatory Visit: Payer: PPO | Admitting: Family Medicine

## 2021-06-06 ENCOUNTER — Encounter: Payer: Self-pay | Admitting: Family Medicine

## 2021-06-06 ENCOUNTER — Ambulatory Visit (INDEPENDENT_AMBULATORY_CARE_PROVIDER_SITE_OTHER): Payer: PPO | Admitting: Family Medicine

## 2021-06-06 VITALS — BP 122/68 | HR 78 | Temp 98.1°F | Ht 71.0 in | Wt 232.6 lb

## 2021-06-06 DIAGNOSIS — N529 Male erectile dysfunction, unspecified: Secondary | ICD-10-CM

## 2021-06-06 DIAGNOSIS — I10 Essential (primary) hypertension: Secondary | ICD-10-CM | POA: Diagnosis not present

## 2021-06-06 DIAGNOSIS — R17 Unspecified jaundice: Secondary | ICD-10-CM | POA: Diagnosis not present

## 2021-06-06 DIAGNOSIS — Z125 Encounter for screening for malignant neoplasm of prostate: Secondary | ICD-10-CM

## 2021-06-06 LAB — HEPATIC FUNCTION PANEL
ALT: 10 U/L (ref 0–53)
AST: 15 U/L (ref 0–37)
Albumin: 4.4 g/dL (ref 3.5–5.2)
Alkaline Phosphatase: 39 U/L (ref 39–117)
Bilirubin, Direct: 0.2 mg/dL (ref 0.0–0.3)
Total Bilirubin: 0.9 mg/dL (ref 0.2–1.2)
Total Protein: 6.9 g/dL (ref 6.0–8.3)

## 2021-06-06 LAB — PSA: PSA: 0.27 ng/mL (ref 0.10–4.00)

## 2021-06-06 MED ORDER — AMLODIPINE BESYLATE 5 MG PO TABS: 5.0000 mg | ORAL_TABLET | Freq: Every day | ORAL | 1 refills | Status: DC

## 2021-06-06 MED ORDER — SILDENAFIL CITRATE 20 MG PO TABS: 20.0000 mg | ORAL_TABLET | Freq: Every day | ORAL | 1 refills | Status: DC | PRN

## 2021-06-06 MED ORDER — LISINOPRIL-HYDROCHLOROTHIAZIDE 20-25 MG PO TABS
1.0000 | ORAL_TABLET | Freq: Every day | ORAL | 1 refills | Status: DC
Start: 2021-06-06 — End: 2022-01-15

## 2021-06-06 NOTE — Progress Notes (Signed)
Subjective:  Patient ID: Brian Mcguire, male    DOB: [birth year]  Age: 71 y.o. MRN: [id number]  CC:  Chief Complaint  Patient presents with   Hypertension    Pt here for refill and follow up   Erectile Dysfunction    Pt need of refill     HPI RUAIRI STUTSMAN presents for   Hypertension: Treated with lisinopril HCTZ 20/25 mg daily, amlodipine 5 mg daily.  Elevated readings at last visit in September. Has been working on diet and activity. Cutting back on alcohol as well. Working more since start of year. Goal 210.  No home BP reading.  Has rotator cuff surgery Monday. Cutting back on vitamins.  Wt Readings from Last 3 Encounters:  06/06/21 232 lb 9.6 oz (105.5 kg)  01/09/21 243 lb 6.4 oz (110.4 kg)  07/03/20 231 lb 11.3 oz (105.1 kg)   BP Readings from Last 3 Encounters:  06/06/21 122/68  01/09/21 (!) 142/90  07/04/20 (!) 156/91   Lab Results  Component Value Date   CREATININE 1.08 01/14/2021    Erectile dysfunction Treated with sildenafil 20 mg - denies any hearing or vision changes, no chest pain or dyspnea with exertion.  No new side effects and current dose has been working sufficiently at low dose.   Prostate cancer screening: Potential risk versus benefits and limitations of prostate screening have been discussed previously.  He does have a family history of prostate cancer with his brother having prostate cancer and would like to have PSA checked,  The natural history of prostate cancer and ongoing controversy regarding screening and potential treatment outcomes of prostate cancer has been discussed with the patient. The meaning of a false positive PSA and a false negative PSA has been discussed. He indicates understanding of the limitations of this screening test and wishes  to proceed with screening PSA testing. Lab Results  Component Value Date   PSA1 0.3 09/29/2019   PSA1 0.8 07/12/2018   PSA 0.28 12/03/2015   Elevated bilirubin Noted on September labs,  total bilirubin 1.5.  Previous reading of 1.0 - plan to repeat today.  Denies any new nausea/vomiting, abdominal pain, or yellowing of the skin/eyes.   History Patient Active Problem List   Diagnosis Date Noted   Substance dependency ([place]) 12/03/2015   No past medical history on file. Past Surgical History:  Procedure Laterality Date   CHOLECYSTECTOMY     COLON SURGERY     No Known Allergies Prior to Admission medications   Medication Sig Start Date End Date Taking? Authorizing Provider  amLODipine (NORVASC) 5 MG tablet Take 1 tablet (5 mg total) by mouth daily. 01/09/21  Yes Wendie Agreste, MD  lisinopril-hydrochlorothiazide (ZESTORETIC) 20-25 MG tablet Take 1 tablet by mouth daily. 01/09/21  Yes Wendie Agreste, MD  sildenafil (REVATIO) 20 MG tablet Take 1 tablet (20 mg total) by mouth daily as needed. 01/09/21  Yes Wendie Agreste, MD   Social History   Socioeconomic History   Marital status: Divorced    Spouse name: Not on file   Number of children: 2   Years of education: Not on file   Highest education level: Some college, no degree  Occupational History   Not on file  Tobacco Use   Smoking status: Never   Smokeless tobacco: Never  Vaping Use   Vaping Use: Never used  Substance and Sexual Activity   Alcohol use: Yes    Alcohol/week: 3.0 standard drinks  Types: 1 Cans of beer, 1 Glasses of wine, 1 Shots of liquor per week    Comment: pt states he has about 3 aday if he were to avrage it out along a week    Drug use: No   Sexual activity: Never  Other Topics Concern   Not on file  Social History Narrative   Not on file   Social Determinants of Health   Financial Resource Strain: Not on file  Food Insecurity: Not on file  Transportation Needs: Not on file  Physical Activity: Not on file  Stress: Not on file  Social Connections: Not on file  Intimate Partner Violence: Not on file    Review of Systems Per HPI.   Objective:   Vitals:   06/06/21 1027   BP: 122/68  Pulse: 78  Temp: 98.1 F (36.7 C)  TempSrc: Temporal  SpO2: 98%  Weight: 232 lb 9.6 oz (105.5 kg)  Height: 5\' 11"  (1.803 m)     Physical Exam Vitals reviewed.  Constitutional:      Appearance: He is well-developed.  HENT:     Head: Normocephalic and atraumatic.  Neck:     Vascular: No carotid bruit or JVD.  Cardiovascular:     Rate and Rhythm: Normal rate and regular rhythm.     Heart sounds: Normal heart sounds. No murmur heard. Pulmonary:     Effort: Pulmonary effort is normal.     Breath sounds: Normal breath sounds. No rales.  Abdominal:     Tenderness: There is no abdominal tenderness. There is no guarding.  Musculoskeletal:     Right lower leg: No edema.     Left lower leg: No edema.  Skin:    General: Skin is warm and dry.  Neurological:     Mental Status: He is alert and oriented to person, place, and time.  Psychiatric:        Mood and Affect: Mood normal.        Behavior: Behavior normal.     Assessment & Plan:  NOEH SPARACINO is a 71 y.o. male . Elevated bilirubin - Plan: Hepatic function panel  -Repeat hepatic function panel, asymptomatic.  Less alcohol use as above.  Essential hypertension - Plan: amLODipine (NORVASC) 5 MG tablet, lisinopril-hydrochlorothiazide (ZESTORETIC) 20-25 MG tablet  -Improved with weight loss, decrease alcohol, continue same regimen.  Commended on changes.  Erectile dysfunction, unspecified erectile dysfunction type - Plan: sildenafil (REVATIO) 20 MG tablet  - sildenafil Rx given - use lowest effective dose. Side effects discussed (including but not limited to headache/flushing, blue discoloration of vision, possible vascular steal and risk of cardiac effects if underlying unknown coronary artery disease, and permanent sensorineural hearing loss). Understanding expressed.  Screening for prostate cancer - Plan: PSA As above, family history of prostate cancer, aware of risk and benefits, false positives, false  negatives.  Check PSA.  Meds ordered this encounter  Medications   amLODipine (NORVASC) 5 MG tablet    Sig: Take 1 tablet (5 mg total) by mouth daily.    Dispense:  90 tablet    Refill:  1   lisinopril-hydrochlorothiazide (ZESTORETIC) 20-25 MG tablet    Sig: Take 1 tablet by mouth daily.    Dispense:  90 tablet    Refill:  1   sildenafil (REVATIO) 20 MG tablet    Sig: Take 1 tablet (20 mg total) by mouth daily as needed.    Dispense:  90 tablet    Refill:  1  Patient Instructions  Congrats on the weight loss! Blood pressure looks better.  Keep up the good work.  If any concerns on repeat labs I will let you know.  Follow-up in 6 months.  Take care.     Signed,   [name], MD [facility] 06/06/21 11:08 AM

## 2021-06-06 NOTE — Patient Instructions (Addendum)
Congrats on the weight loss! Blood pressure looks better.  Keep up the good work.  If any concerns on repeat labs I will let you know.  Follow-up in 6 months.  Take care.

## 2021-06-10 DIAGNOSIS — S46012A Strain of muscle(s) and tendon(s) of the rotator cuff of left shoulder, initial encounter: Secondary | ICD-10-CM | POA: Diagnosis not present

## 2021-06-10 DIAGNOSIS — X58XXXA Exposure to other specified factors, initial encounter: Secondary | ICD-10-CM | POA: Diagnosis not present

## 2021-06-10 DIAGNOSIS — G8918 Other acute postprocedural pain: Secondary | ICD-10-CM | POA: Diagnosis not present

## 2021-06-10 DIAGNOSIS — S43432A Superior glenoid labrum lesion of left shoulder, initial encounter: Secondary | ICD-10-CM | POA: Diagnosis not present

## 2021-06-10 DIAGNOSIS — M7522 Bicipital tendinitis, left shoulder: Secondary | ICD-10-CM | POA: Diagnosis not present

## 2021-06-10 DIAGNOSIS — M24112 Other articular cartilage disorders, left shoulder: Secondary | ICD-10-CM | POA: Diagnosis not present

## 2021-06-10 DIAGNOSIS — M7542 Impingement syndrome of left shoulder: Secondary | ICD-10-CM | POA: Diagnosis not present

## 2021-06-10 DIAGNOSIS — M75122 Complete rotator cuff tear or rupture of left shoulder, not specified as traumatic: Secondary | ICD-10-CM | POA: Diagnosis not present

## 2021-06-10 DIAGNOSIS — Y999 Unspecified external cause status: Secondary | ICD-10-CM | POA: Diagnosis not present

## 2021-06-11 DIAGNOSIS — S46112D Strain of muscle, fascia and tendon of long head of biceps, left arm, subsequent encounter: Secondary | ICD-10-CM | POA: Diagnosis not present

## 2021-06-11 DIAGNOSIS — M6281 Muscle weakness (generalized): Secondary | ICD-10-CM | POA: Diagnosis not present

## 2021-06-11 DIAGNOSIS — M7542 Impingement syndrome of left shoulder: Secondary | ICD-10-CM | POA: Diagnosis not present

## 2021-06-11 DIAGNOSIS — S46012D Strain of muscle(s) and tendon(s) of the rotator cuff of left shoulder, subsequent encounter: Secondary | ICD-10-CM | POA: Diagnosis not present

## 2021-06-11 DIAGNOSIS — M25512 Pain in left shoulder: Secondary | ICD-10-CM | POA: Diagnosis not present

## 2021-06-11 DIAGNOSIS — M25612 Stiffness of left shoulder, not elsewhere classified: Secondary | ICD-10-CM | POA: Diagnosis not present

## 2021-06-14 DIAGNOSIS — M7542 Impingement syndrome of left shoulder: Secondary | ICD-10-CM | POA: Diagnosis not present

## 2021-06-14 DIAGNOSIS — S46012D Strain of muscle(s) and tendon(s) of the rotator cuff of left shoulder, subsequent encounter: Secondary | ICD-10-CM | POA: Diagnosis not present

## 2021-06-14 DIAGNOSIS — M25612 Stiffness of left shoulder, not elsewhere classified: Secondary | ICD-10-CM | POA: Diagnosis not present

## 2021-06-14 DIAGNOSIS — M6281 Muscle weakness (generalized): Secondary | ICD-10-CM | POA: Diagnosis not present

## 2021-06-14 DIAGNOSIS — M25512 Pain in left shoulder: Secondary | ICD-10-CM | POA: Diagnosis not present

## 2021-06-14 DIAGNOSIS — S46112D Strain of muscle, fascia and tendon of long head of biceps, left arm, subsequent encounter: Secondary | ICD-10-CM | POA: Diagnosis not present

## 2021-06-17 HISTORY — PX: ROTATOR CUFF REPAIR: SHX139

## 2021-06-18 DIAGNOSIS — M25612 Stiffness of left shoulder, not elsewhere classified: Secondary | ICD-10-CM | POA: Diagnosis not present

## 2021-06-18 DIAGNOSIS — S46112D Strain of muscle, fascia and tendon of long head of biceps, left arm, subsequent encounter: Secondary | ICD-10-CM | POA: Diagnosis not present

## 2021-06-18 DIAGNOSIS — M25512 Pain in left shoulder: Secondary | ICD-10-CM | POA: Diagnosis not present

## 2021-06-18 DIAGNOSIS — M6281 Muscle weakness (generalized): Secondary | ICD-10-CM | POA: Diagnosis not present

## 2021-06-18 DIAGNOSIS — S46012D Strain of muscle(s) and tendon(s) of the rotator cuff of left shoulder, subsequent encounter: Secondary | ICD-10-CM | POA: Diagnosis not present

## 2021-06-18 DIAGNOSIS — M7542 Impingement syndrome of left shoulder: Secondary | ICD-10-CM | POA: Diagnosis not present

## 2021-06-20 DIAGNOSIS — M6281 Muscle weakness (generalized): Secondary | ICD-10-CM | POA: Diagnosis not present

## 2021-06-20 DIAGNOSIS — M25512 Pain in left shoulder: Secondary | ICD-10-CM | POA: Diagnosis not present

## 2021-06-20 DIAGNOSIS — M7542 Impingement syndrome of left shoulder: Secondary | ICD-10-CM | POA: Diagnosis not present

## 2021-06-20 DIAGNOSIS — S46012D Strain of muscle(s) and tendon(s) of the rotator cuff of left shoulder, subsequent encounter: Secondary | ICD-10-CM | POA: Diagnosis not present

## 2021-06-20 DIAGNOSIS — M25612 Stiffness of left shoulder, not elsewhere classified: Secondary | ICD-10-CM | POA: Diagnosis not present

## 2021-06-20 DIAGNOSIS — S46112D Strain of muscle, fascia and tendon of long head of biceps, left arm, subsequent encounter: Secondary | ICD-10-CM | POA: Diagnosis not present

## 2021-06-24 DIAGNOSIS — M7542 Impingement syndrome of left shoulder: Secondary | ICD-10-CM | POA: Diagnosis not present

## 2021-06-24 DIAGNOSIS — S46012D Strain of muscle(s) and tendon(s) of the rotator cuff of left shoulder, subsequent encounter: Secondary | ICD-10-CM | POA: Diagnosis not present

## 2021-06-24 DIAGNOSIS — M25512 Pain in left shoulder: Secondary | ICD-10-CM | POA: Diagnosis not present

## 2021-06-24 DIAGNOSIS — S46112D Strain of muscle, fascia and tendon of long head of biceps, left arm, subsequent encounter: Secondary | ICD-10-CM | POA: Diagnosis not present

## 2021-06-24 DIAGNOSIS — M6281 Muscle weakness (generalized): Secondary | ICD-10-CM | POA: Diagnosis not present

## 2021-06-24 DIAGNOSIS — M25612 Stiffness of left shoulder, not elsewhere classified: Secondary | ICD-10-CM | POA: Diagnosis not present

## 2021-06-27 DIAGNOSIS — M7542 Impingement syndrome of left shoulder: Secondary | ICD-10-CM | POA: Diagnosis not present

## 2021-06-27 DIAGNOSIS — M25512 Pain in left shoulder: Secondary | ICD-10-CM | POA: Diagnosis not present

## 2021-06-27 DIAGNOSIS — S46112D Strain of muscle, fascia and tendon of long head of biceps, left arm, subsequent encounter: Secondary | ICD-10-CM | POA: Diagnosis not present

## 2021-06-27 DIAGNOSIS — M6281 Muscle weakness (generalized): Secondary | ICD-10-CM | POA: Diagnosis not present

## 2021-06-27 DIAGNOSIS — S46012D Strain of muscle(s) and tendon(s) of the rotator cuff of left shoulder, subsequent encounter: Secondary | ICD-10-CM | POA: Diagnosis not present

## 2021-06-27 DIAGNOSIS — M25612 Stiffness of left shoulder, not elsewhere classified: Secondary | ICD-10-CM | POA: Diagnosis not present

## 2021-07-02 DIAGNOSIS — M6281 Muscle weakness (generalized): Secondary | ICD-10-CM | POA: Diagnosis not present

## 2021-07-02 DIAGNOSIS — S46012D Strain of muscle(s) and tendon(s) of the rotator cuff of left shoulder, subsequent encounter: Secondary | ICD-10-CM | POA: Diagnosis not present

## 2021-07-02 DIAGNOSIS — S46112D Strain of muscle, fascia and tendon of long head of biceps, left arm, subsequent encounter: Secondary | ICD-10-CM | POA: Diagnosis not present

## 2021-07-02 DIAGNOSIS — M7542 Impingement syndrome of left shoulder: Secondary | ICD-10-CM | POA: Diagnosis not present

## 2021-07-02 DIAGNOSIS — M25512 Pain in left shoulder: Secondary | ICD-10-CM | POA: Diagnosis not present

## 2021-07-02 DIAGNOSIS — M25612 Stiffness of left shoulder, not elsewhere classified: Secondary | ICD-10-CM | POA: Diagnosis not present

## 2021-07-04 DIAGNOSIS — M25512 Pain in left shoulder: Secondary | ICD-10-CM | POA: Diagnosis not present

## 2021-07-04 DIAGNOSIS — S46012D Strain of muscle(s) and tendon(s) of the rotator cuff of left shoulder, subsequent encounter: Secondary | ICD-10-CM | POA: Diagnosis not present

## 2021-07-04 DIAGNOSIS — M6281 Muscle weakness (generalized): Secondary | ICD-10-CM | POA: Diagnosis not present

## 2021-07-04 DIAGNOSIS — S46112D Strain of muscle, fascia and tendon of long head of biceps, left arm, subsequent encounter: Secondary | ICD-10-CM | POA: Diagnosis not present

## 2021-07-04 DIAGNOSIS — M25612 Stiffness of left shoulder, not elsewhere classified: Secondary | ICD-10-CM | POA: Diagnosis not present

## 2021-07-04 DIAGNOSIS — M7542 Impingement syndrome of left shoulder: Secondary | ICD-10-CM | POA: Diagnosis not present

## 2021-07-08 DIAGNOSIS — M25612 Stiffness of left shoulder, not elsewhere classified: Secondary | ICD-10-CM | POA: Diagnosis not present

## 2021-07-08 DIAGNOSIS — M6281 Muscle weakness (generalized): Secondary | ICD-10-CM | POA: Diagnosis not present

## 2021-07-08 DIAGNOSIS — S46112D Strain of muscle, fascia and tendon of long head of biceps, left arm, subsequent encounter: Secondary | ICD-10-CM | POA: Diagnosis not present

## 2021-07-08 DIAGNOSIS — M7542 Impingement syndrome of left shoulder: Secondary | ICD-10-CM | POA: Diagnosis not present

## 2021-07-08 DIAGNOSIS — S46012D Strain of muscle(s) and tendon(s) of the rotator cuff of left shoulder, subsequent encounter: Secondary | ICD-10-CM | POA: Diagnosis not present

## 2021-07-08 DIAGNOSIS — M25512 Pain in left shoulder: Secondary | ICD-10-CM | POA: Diagnosis not present

## 2021-07-11 ENCOUNTER — Ambulatory Visit (INDEPENDENT_AMBULATORY_CARE_PROVIDER_SITE_OTHER): Payer: PPO | Admitting: Family Medicine

## 2021-07-11 ENCOUNTER — Encounter: Payer: Self-pay | Admitting: Family Medicine

## 2021-07-11 VITALS — BP 134/76 | HR 78 | Temp 97.8°F | Resp 15 | Ht 71.0 in | Wt 221.8 lb

## 2021-07-11 DIAGNOSIS — S46012D Strain of muscle(s) and tendon(s) of the rotator cuff of left shoulder, subsequent encounter: Secondary | ICD-10-CM | POA: Diagnosis not present

## 2021-07-11 DIAGNOSIS — M25512 Pain in left shoulder: Secondary | ICD-10-CM | POA: Diagnosis not present

## 2021-07-11 DIAGNOSIS — E785 Hyperlipidemia, unspecified: Secondary | ICD-10-CM | POA: Diagnosis not present

## 2021-07-11 DIAGNOSIS — Z0000 Encounter for general adult medical examination without abnormal findings: Secondary | ICD-10-CM | POA: Diagnosis not present

## 2021-07-11 DIAGNOSIS — M25612 Stiffness of left shoulder, not elsewhere classified: Secondary | ICD-10-CM | POA: Diagnosis not present

## 2021-07-11 DIAGNOSIS — I10 Essential (primary) hypertension: Secondary | ICD-10-CM | POA: Diagnosis not present

## 2021-07-11 DIAGNOSIS — Z131 Encounter for screening for diabetes mellitus: Secondary | ICD-10-CM | POA: Diagnosis not present

## 2021-07-11 DIAGNOSIS — S46112D Strain of muscle, fascia and tendon of long head of biceps, left arm, subsequent encounter: Secondary | ICD-10-CM | POA: Diagnosis not present

## 2021-07-11 DIAGNOSIS — M7542 Impingement syndrome of left shoulder: Secondary | ICD-10-CM | POA: Diagnosis not present

## 2021-07-11 DIAGNOSIS — M6281 Muscle weakness (generalized): Secondary | ICD-10-CM | POA: Diagnosis not present

## 2021-07-11 LAB — COMPREHENSIVE METABOLIC PANEL
ALT: 12 U/L (ref 0–53)
AST: 14 U/L (ref 0–37)
Albumin: 4.7 g/dL (ref 3.5–5.2)
Alkaline Phosphatase: 44 U/L (ref 39–117)
BUN: 18 mg/dL (ref 6–23)
CO2: 31 mEq/L (ref 19–32)
Calcium: 10.3 mg/dL (ref 8.4–10.5)
Chloride: 96 mEq/L (ref 96–112)
Creatinine, Ser: 1.04 mg/dL (ref 0.40–1.50)
GFR: 72.55 mL/min (ref >60.00)
Glucose, Bld: 90 mg/dL (ref 70–99)
Potassium: 3.9 mEq/L (ref 3.5–5.1)
Sodium: 137 mEq/L (ref 135–145)
Total Bilirubin: 0.9 mg/dL (ref 0.2–1.2)
Total Protein: 7.2 g/dL (ref 6.0–8.3)

## 2021-07-11 LAB — LIPID PANEL
Cholesterol: 181 mg/dL (ref 0–200)
HDL: 48.1 mg/dL (ref >39.00)
LDL Cholesterol: 117 mg/dL — ABNORMAL HIGH (ref 0–99)
NonHDL: 132.46
Total CHOL/HDL Ratio: 4
Triglycerides: 79 mg/dL (ref 0.0–149.0)
VLDL: 15.8 mg/dL (ref 0.0–40.0)

## 2021-07-11 LAB — HEMOGLOBIN A1C: Hgb A1c MFr Bld: 5.3 % (ref 4.6–6.5)

## 2021-07-11 NOTE — Progress Notes (Signed)
Subjective:  Patient ID: Brian Mcguire, male    DOB: [birth year]  Age: 71 y.o. MRN: [id number]  CC:  Chief Complaint  Patient presents with   Medicare Wellness    No questions today, pt is fasting for blood work, patient had eye exam at Dr Inocencio Homes office will request records     HPI Brian Mcguire presents for   Presents for annual wellness exam.  Reviewed chronic medical problems and meds February 2nd.  Prior elevated bilirubin normalized.PSA normal  Care team: PCP: me Ortho: Dr. Griffin Basil, Dr. Alfonso Ramus. 4 weeks out from left RTC surgery. Recovering well.  Derm: Dr. Denna Haggard ENT, Dr. Doretha Imus, Dr. Gershon Crane  GI, Dr. Watt Climes  Fall screening Fall Risk  07/11/2021 06/06/2021 01/09/2021 11/02/2019 09/29/2019  Falls in the past year? '1 1 1 '$ 0 0  Number falls in past yr: 0 0 0 - -  Injury with Fall? '1 1 1 '$ - -  Comment - rotstor repair 06/10/21 - - -  Risk for fall due to : History of fall(s);No Fall Risks No Fall Risks;History of fall(s) History of fall(s) - -  Follow up Falls evaluation completed Falls evaluation completed Falls evaluation completed Falls evaluation completed Falls evaluation completed  Fall 9 months ago. Tripped on yard equipment. Injured RTC   Lighting in home: adequate.  Loose rugs/carpets/pets: none Stairs:1 flight with handrail.  Grab bars in bathroom:none - towel rod, door jam to hold onto, rubber mat in tub.  Timed up and go:9 seconds, normal gait, no instability.   Depression Screening: Depression screen [facility] 2/9 07/11/2021 06/06/2021 01/09/2021 11/02/2019 09/29/2019  Decreased Interest 0 0 0 0 0  Down, Depressed, Hopeless 0 0 0 0 0  PHQ - 2 Score 0 0 0 0 0  Altered sleeping - - 0 - -  Tired, decreased energy - - 0 - -  Change in appetite - - 0 - -  Feeling bad or failure about yourself  - - 0 - -  Trouble concentrating - - 0 - -  Moving slowly or fidgety/restless - - 0 - -  Suicidal thoughts - - 0 - -  PHQ-9 Score - - 0 - -  Difficult doing work/chores - - Not  difficult at all - -    Cancer Screening: Colonoscopy 11/26/2015 with repeat planned in 10 years. Normal PSA last month. Lab Results  Component Value Date   PSA1 0.3 09/29/2019   PSA1 0.8 07/12/2018   PSA 0.27 06/06/2021   PSA 0.28 12/03/2015  Derm: saw derm in past 6 months for skin CA screening  Immunization History  Administered Date(s) Administered   Pneumococcal Conjugate-13 12/03/2015   Tdap 07/03/2020  COVID-vaccine, declines Flu vaccine, declines Shingles vaccine/Shingrix: declines at this time.  Pneumonia vaccine: Prevnar 2017, Pneumovax recommended today. Declined.   Functional Status Survey: Is the patient deaf or have difficulty hearing?: No Does the patient have difficulty seeing, even when wearing glasses/contacts?: No Does the patient have difficulty concentrating, remembering, or making decisions?: No Does the patient have difficulty walking or climbing stairs?: No Does the patient have difficulty dressing or bathing?: No Does the patient have difficulty doing errands alone such as visiting a doctor's office or shopping?: No  Memory Screen: 6CIT Screen 07/11/2021 09/29/2019 07/12/2018 03/23/2017  What Year? 0 points 0 points 0 points 0 points  What month? 0 points 0 points 0 points 0 points  What time? 0 points 0 points 0 points 0 points  Count back from  20 0 points 0 points 0 points 0 points  Months in reverse 0 points 0 points 0 points 0 points  Repeat phrase 0 points 0 points 0 points 2 points  Total Score 0 0 0 2    Alcohol Screening: [place] Office Visit from 07/11/2021 in [place]  AUDIT-C Score 0     Cut back  - 2-3 glasses of wine few days a a week at the most.   Tobacco: none  No results found. Optho/optometry: Gershon Crane - 6 months ago. Record requested.   Dental: Due - plans to schedule.   Exercise: Walking daily or at least 3-6 days per week, 45 min. eating better. Losing weight.  Wt Readings  from Last 3 Encounters:  07/11/21 221 lb 12.8 oz (100.6 kg)  06/06/21 232 lb 9.6 oz (105.5 kg)  01/09/21 243 lb 6.4 oz (110.4 kg)    Advanced Directives:  Info provided.   History Patient Active Problem List   Diagnosis Date Noted   Substance dependency ([place]) 12/03/2015   History reviewed. No pertinent past medical history. Past Surgical History:  Procedure Laterality Date   CHOLECYSTECTOMY     COLON SURGERY     ROTATOR CUFF REPAIR Left 06/17/2021   No Known Allergies Prior to Admission medications   Medication Sig Start Date End Date Taking? Authorizing Provider  amLODipine (NORVASC) 5 MG tablet Take 1 tablet (5 mg total) by mouth daily. 06/06/21  Yes Wendie Agreste, MD  lisinopril-hydrochlorothiazide (ZESTORETIC) 20-25 MG tablet Take 1 tablet by mouth daily. 06/06/21  Yes Wendie Agreste, MD  sildenafil (REVATIO) 20 MG tablet Take 1 tablet (20 mg total) by mouth daily as needed. 06/06/21  Yes Wendie Agreste, MD   Social History   Socioeconomic History   Marital status: Divorced    Spouse name: Not on file   Number of children: 2   Years of education: Not on file   Highest education level: Some college, no degree  Occupational History   Not on file  Tobacco Use   Smoking status: Never   Smokeless tobacco: Never  Vaping Use   Vaping Use: Never used  Substance and Sexual Activity   Alcohol use: Yes    Alcohol/week: 3.0 standard drinks    Types: 1 Glasses of wine, 1 Cans of beer, 1 Shots of liquor per week    Comment: pt notes 3-4 drinks a week   Drug use: No   Sexual activity: Never  Other Topics Concern   Not on file  Social History Narrative   Not on file   Social Determinants of Health   Financial Resource Strain: Not on file  Food Insecurity: Not on file  Transportation Needs: Not on file  Physical Activity: Not on file  Stress: Not on file  Social Connections: Not on file  Intimate Partner Violence: Not on file    Review of Systems  13 point  review of systems per patient health survey noted.  Negative other than as indicated above or in HPI.   Objective:   Vitals:   07/11/21 0759 07/11/21 0808  BP: (!) 144/88 134/76  Pulse: 78   Resp: 15   Temp: 97.8 F (36.6 C)   TempSrc: Temporal   SpO2: 99%   Weight: 221 lb 12.8 oz (100.6 kg)   Height: '5\' 11"'$  (1.803 m)      Physical Exam Vitals reviewed.  Constitutional:      Appearance: He is well-developed.  HENT:     Head: Normocephalic and atraumatic.     Right Ear: External ear normal.     Left Ear: External ear normal.  Eyes:     Conjunctiva/sclera: Conjunctivae normal.     Pupils: Pupils are equal, round, and reactive to light.  Neck:     Thyroid: No thyromegaly.  Cardiovascular:     Rate and Rhythm: Normal rate and regular rhythm.     Heart sounds: Normal heart sounds.  Pulmonary:     Effort: Pulmonary effort is normal. No respiratory distress.     Breath sounds: Normal breath sounds. No wheezing.  Abdominal:     General: There is no distension.     Palpations: Abdomen is soft.     Tenderness: There is no abdominal tenderness.  Musculoskeletal:        General: No tenderness. Normal range of motion.     Cervical back: Normal range of motion and neck supple.  Lymphadenopathy:     Cervical: No cervical adenopathy.  Skin:    General: Skin is warm and dry.  Neurological:     Mental Status: He is alert and oriented to person, place, and time.     Deep Tendon Reflexes: Reflexes are normal and symmetric.  Psychiatric:        Behavior: Behavior normal.       Assessment & Plan:  Brian Mcguire is a 71 y.o. male . Medicare annual wellness visit, subsequent - Plan: Comprehensive metabolic panel, Lipid panel, Hemoglobin A1c  - - anticipatory guidance as below in AVS, screening labs if needed. Health maintenance items as above in HPI discussed/recommended as applicable.  - no concerning responses on depression, fall, or functional status screening. Any positive  responses noted as above. Advanced directives discussed as in [facility] - handout given.  -vaccines declined as above.   Essential hypertension - Plan: Comprehensive metabolic panel  Hyperlipidemia, unspecified hyperlipidemia type - Plan: Lipid panel  - mild prior elevation- anticipate improvement with weight loss- commended on his efforts.  Screening for diabetes mellitus - Plan: Hemoglobin A1c   No orders of the defined types were placed in this encounter.  Patient Instructions  Keep up the good work with exercise, diet and weight loss.  I suspect your numbers on labs will look better than last year.  I do recommend shingles vaccine, that can be given at your pharmacy.  Second dose of pneumonia vaccine also due if or when you would like to have that given.  Let me know if there are questions with the advanced directives paperwork.  Take care.   Preventive Care 41 Years and Older, Male Preventive care refers to lifestyle choices and visits with your health care provider that can promote health and wellness. Preventive care visits are also called wellness exams. What can I expect for my preventive care visit? Counseling During your preventive care visit, your health care provider may ask about your: Medical history, including: Past medical problems. Family medical history. History of falls. Current health, including: Emotional well-being. Home life and relationship well-being. Sexual activity. Memory and ability to understand (cognition). Lifestyle, including: Alcohol, nicotine or tobacco, and drug use. Access to firearms. Diet, exercise, and sleep habits. Work and work Statistician. Sunscreen use. Safety issues such as seatbelt and bike helmet use. Physical exam Your health care provider will check your: Height and weight. These may be used to calculate your BMI (body mass index). BMI is a measurement that tells if you are at a  healthy weight. Waist circumference. This measures the  distance around your waistline. This measurement also tells if you are at a healthy weight and may help predict your risk of certain diseases, such as type 2 diabetes and high blood pressure. Heart rate and blood pressure. Body temperature. Skin for abnormal spots. What immunizations do I need? Vaccines are usually given at various ages, according to a schedule. Your health care provider will recommend vaccines for you based on your age, medical history, and lifestyle or other factors, such as travel or where you work. What tests do I need? Screening Your health care provider may recommend screening tests for certain conditions. This may include: Lipid and cholesterol levels. Diabetes screening. This is done by checking your blood sugar (glucose) after you have not eaten for a while (fasting). Hepatitis C test. Hepatitis B test. HIV (human immunodeficiency virus) test. STI (sexually transmitted infection) testing, if you are at risk. Lung cancer screening. Colorectal cancer screening. Prostate cancer screening. Abdominal aortic aneurysm (AAA) screening. You may need this if you are a current or former smoker. Talk with your health care provider about your test results, treatment options, and if necessary, the need for more tests. Follow these instructions at home: Eating and drinking  Eat a diet that includes fresh fruits and vegetables, whole grains, lean protein, and low-fat dairy products. Limit your intake of foods with high amounts of sugar, saturated fats, and salt. Take vitamin and mineral supplements as recommended by your health care provider. Do not drink alcohol if your health care provider tells you not to drink. If you drink alcohol: Limit how much you have to 0-2 drinks a day. Know how much alcohol is in your drink. In the [place] one drink equals one 12 oz bottle of beer (355 mL), one 5 oz glass of wine (148 mL), or one 1 oz glass of hard liquor (44 mL). Lifestyle Brush  your teeth every morning and night with fluoride toothpaste. Floss one time each day. Exercise for at least 30 minutes 5 or more days each week. Do not use any products that contain nicotine or tobacco. These products include cigarettes, chewing tobacco, and vaping devices, such as e-cigarettes. If you need help quitting, ask your health care provider. Do not use drugs. If you are sexually active, practice safe sex. Use a condom or other form of protection to prevent STIs. Take aspirin only as told by your health care provider. Make sure that you understand how much to take and what form to take. Work with your health care provider to find out whether it is safe and beneficial for you to take aspirin daily. Ask your health care provider if you need to take a cholesterol-lowering medicine (statin). Find healthy ways to manage stress, such as: Meditation, yoga, or listening to music. Journaling. Talking to a trusted person. Spending time with friends and family. Safety Always wear your seat belt while driving or riding in a vehicle. Do not drive: If you have been drinking alcohol. Do not ride with someone who has been drinking. When you are tired or distracted. While texting. If you have been using any mind-altering substances or drugs. Wear a helmet and other protective equipment during sports activities. If you have firearms in your house, make sure you follow all gun safety procedures. Minimize exposure to UV radiation to reduce your risk of skin cancer. What's next? Visit your health care provider once a year for an annual wellness visit. Ask your health care  provider how often you should have your eyes and teeth checked. Stay up to date on all vaccines. This information is not intended to replace advice given to you by your health care provider. Make sure you discuss any questions you have with your health care provider. Document Revised: 10/17/2020 Document Reviewed:  10/17/2020 Elsevier Patient Education  2022 [place],   [name], MD [place] 07/11/21 8:42 AM

## 2021-07-11 NOTE — Patient Instructions (Signed)
Keep up the good work with exercise, diet and weight loss.  I suspect your numbers on labs will look better than last year.  I do recommend shingles vaccine, that can be given at your pharmacy.  Second dose of pneumonia vaccine also due if or when you would like to have that given.  Let me know if there are questions with the advanced directives paperwork.  Take care.  ? ?Preventive Care 82 Years and Older, Male ?Preventive care refers to lifestyle choices and visits with your health care provider that can promote health and wellness. Preventive care visits are also called wellness exams. ?What can I expect for my preventive care visit? ?Counseling ?During your preventive care visit, your health care provider may ask about your: ?Medical history, including: ?Past medical problems. ?Family medical history. ?History of falls. ?Current health, including: ?Emotional well-being. ?Home life and relationship well-being. ?Sexual activity. ?Memory and ability to understand (cognition). ?Lifestyle, including: ?Alcohol, nicotine or tobacco, and drug use. ?Access to firearms. ?Diet, exercise, and sleep habits. ?Work and work Statistician. ?Sunscreen use. ?Safety issues such as seatbelt and bike helmet use. ?Physical exam ?Your health care provider will check your: ?Height and weight. These may be used to calculate your BMI (body mass index). BMI is a measurement that tells if you are at a healthy weight. ?Waist circumference. This measures the distance around your waistline. This measurement also tells if you are at a healthy weight and may help predict your risk of certain diseases, such as type 2 diabetes and high blood pressure. ?Heart rate and blood pressure. ?Body temperature. ?Skin for abnormal spots. ?What immunizations do I need? ?Vaccines are usually given at various ages, according to a schedule. Your health care provider will recommend vaccines for you based on your age, medical history, and lifestyle or other  factors, such as travel or where you work. ?What tests do I need? ?Screening ?Your health care provider may recommend screening tests for certain conditions. This may include: ?Lipid and cholesterol levels. ?Diabetes screening. This is done by checking your blood sugar (glucose) after you have not eaten for a while (fasting). ?Hepatitis C test. ?Hepatitis B test. ?HIV (human immunodeficiency virus) test. ?STI (sexually transmitted infection) testing, if you are at risk. ?Lung cancer screening. ?Colorectal cancer screening. ?Prostate cancer screening. ?Abdominal aortic aneurysm (AAA) screening. You may need this if you are a current or former smoker. ?Talk with your health care provider about your test results, treatment options, and if necessary, the need for more tests. ?Follow these instructions at home: ?Eating and drinking ? ?Eat a diet that includes fresh fruits and vegetables, whole grains, lean protein, and low-fat dairy products. Limit your intake of foods with high amounts of sugar, saturated fats, and salt. ?Take vitamin and mineral supplements as recommended by your health care provider. ?Do not drink alcohol if your health care provider tells you not to drink. ?If you drink alcohol: ?Limit how much you have to 0-2 drinks a day. ?Know how much alcohol is in your drink. In the [place] one drink equals one 12 oz bottle of beer (355 mL), one 5 oz glass of wine (148 mL), or one 1? oz glass of hard liquor (44 mL). ?Lifestyle ?Brush your teeth every morning and night with fluoride toothpaste. Floss one time each day. ?Exercise for at least 30 minutes 5 or more days each week. ?Do not use any products that contain nicotine or tobacco. These products include cigarettes, chewing tobacco, and vaping devices, such  as e-cigarettes. If you need help quitting, ask your health care provider. ?Do not use drugs. ?If you are sexually active, practice safe sex. Use a condom or other form of protection to prevent STIs. ?Take  aspirin only as told by your health care provider. Make sure that you understand how much to take and what form to take. Work with your health care provider to find out whether it is safe and beneficial for you to take aspirin daily. ?Ask your health care provider if you need to take a cholesterol-lowering medicine (statin). ?Find healthy ways to manage stress, such as: ?Meditation, yoga, or listening to music. ?Journaling. ?Talking to a trusted person. ?Spending time with friends and family. ?Safety ?Always wear your seat belt while driving or riding in a vehicle. ?Do not drive: ?If you have been drinking alcohol. Do not ride with someone who has been drinking. ?When you are tired or distracted. ?While texting. ?If you have been using any mind-altering substances or drugs. ?Wear a helmet and other protective equipment during sports activities. ?If you have firearms in your house, make sure you follow all gun safety procedures. ?Minimize exposure to UV radiation to reduce your risk of skin cancer. ?What's next? ?Visit your health care provider once a year for an annual wellness visit. ?Ask your health care provider how often you should have your eyes and teeth checked. ?Stay up to date on all vaccines. ?This information is not intended to replace advice given to you by your health care provider. Make sure you discuss any questions you have with your health care provider. ?Document Revised: 10/17/2020 Document Reviewed: 10/17/2020 ?Elsevier Patient Education ? [place]. ? ?

## 2021-07-16 DIAGNOSIS — M25512 Pain in left shoulder: Secondary | ICD-10-CM | POA: Diagnosis not present

## 2021-07-18 DIAGNOSIS — M7542 Impingement syndrome of left shoulder: Secondary | ICD-10-CM | POA: Diagnosis not present

## 2021-07-18 DIAGNOSIS — S46112D Strain of muscle, fascia and tendon of long head of biceps, left arm, subsequent encounter: Secondary | ICD-10-CM | POA: Diagnosis not present

## 2021-07-18 DIAGNOSIS — M6281 Muscle weakness (generalized): Secondary | ICD-10-CM | POA: Diagnosis not present

## 2021-07-18 DIAGNOSIS — S46012D Strain of muscle(s) and tendon(s) of the rotator cuff of left shoulder, subsequent encounter: Secondary | ICD-10-CM | POA: Diagnosis not present

## 2021-07-18 DIAGNOSIS — M25512 Pain in left shoulder: Secondary | ICD-10-CM | POA: Diagnosis not present

## 2021-07-18 DIAGNOSIS — M25612 Stiffness of left shoulder, not elsewhere classified: Secondary | ICD-10-CM | POA: Diagnosis not present

## 2021-07-25 DIAGNOSIS — M6281 Muscle weakness (generalized): Secondary | ICD-10-CM | POA: Diagnosis not present

## 2021-07-25 DIAGNOSIS — S46012D Strain of muscle(s) and tendon(s) of the rotator cuff of left shoulder, subsequent encounter: Secondary | ICD-10-CM | POA: Diagnosis not present

## 2021-07-25 DIAGNOSIS — M25512 Pain in left shoulder: Secondary | ICD-10-CM | POA: Diagnosis not present

## 2021-07-25 DIAGNOSIS — M25612 Stiffness of left shoulder, not elsewhere classified: Secondary | ICD-10-CM | POA: Diagnosis not present

## 2021-07-25 DIAGNOSIS — S46112D Strain of muscle, fascia and tendon of long head of biceps, left arm, subsequent encounter: Secondary | ICD-10-CM | POA: Diagnosis not present

## 2021-07-25 DIAGNOSIS — M7542 Impingement syndrome of left shoulder: Secondary | ICD-10-CM | POA: Diagnosis not present

## 2021-07-30 DIAGNOSIS — S46012D Strain of muscle(s) and tendon(s) of the rotator cuff of left shoulder, subsequent encounter: Secondary | ICD-10-CM | POA: Diagnosis not present

## 2021-07-30 DIAGNOSIS — M6281 Muscle weakness (generalized): Secondary | ICD-10-CM | POA: Diagnosis not present

## 2021-07-30 DIAGNOSIS — M7542 Impingement syndrome of left shoulder: Secondary | ICD-10-CM | POA: Diagnosis not present

## 2021-07-30 DIAGNOSIS — S46112D Strain of muscle, fascia and tendon of long head of biceps, left arm, subsequent encounter: Secondary | ICD-10-CM | POA: Diagnosis not present

## 2021-07-30 DIAGNOSIS — M25512 Pain in left shoulder: Secondary | ICD-10-CM | POA: Diagnosis not present

## 2021-07-30 DIAGNOSIS — M25612 Stiffness of left shoulder, not elsewhere classified: Secondary | ICD-10-CM | POA: Diagnosis not present

## 2021-08-02 DIAGNOSIS — S46112D Strain of muscle, fascia and tendon of long head of biceps, left arm, subsequent encounter: Secondary | ICD-10-CM | POA: Diagnosis not present

## 2021-08-02 DIAGNOSIS — M7542 Impingement syndrome of left shoulder: Secondary | ICD-10-CM | POA: Diagnosis not present

## 2021-08-02 DIAGNOSIS — M25512 Pain in left shoulder: Secondary | ICD-10-CM | POA: Diagnosis not present

## 2021-08-02 DIAGNOSIS — S46012D Strain of muscle(s) and tendon(s) of the rotator cuff of left shoulder, subsequent encounter: Secondary | ICD-10-CM | POA: Diagnosis not present

## 2021-08-02 DIAGNOSIS — M6281 Muscle weakness (generalized): Secondary | ICD-10-CM | POA: Diagnosis not present

## 2021-08-02 DIAGNOSIS — M25612 Stiffness of left shoulder, not elsewhere classified: Secondary | ICD-10-CM | POA: Diagnosis not present

## 2021-08-06 DIAGNOSIS — M25612 Stiffness of left shoulder, not elsewhere classified: Secondary | ICD-10-CM | POA: Diagnosis not present

## 2021-08-06 DIAGNOSIS — M6281 Muscle weakness (generalized): Secondary | ICD-10-CM | POA: Diagnosis not present

## 2021-08-06 DIAGNOSIS — M7542 Impingement syndrome of left shoulder: Secondary | ICD-10-CM | POA: Diagnosis not present

## 2021-08-06 DIAGNOSIS — S46012D Strain of muscle(s) and tendon(s) of the rotator cuff of left shoulder, subsequent encounter: Secondary | ICD-10-CM | POA: Diagnosis not present

## 2021-08-06 DIAGNOSIS — M25512 Pain in left shoulder: Secondary | ICD-10-CM | POA: Diagnosis not present

## 2021-08-06 DIAGNOSIS — S46112D Strain of muscle, fascia and tendon of long head of biceps, left arm, subsequent encounter: Secondary | ICD-10-CM | POA: Diagnosis not present

## 2021-08-08 DIAGNOSIS — S46112D Strain of muscle, fascia and tendon of long head of biceps, left arm, subsequent encounter: Secondary | ICD-10-CM | POA: Diagnosis not present

## 2021-08-08 DIAGNOSIS — M7542 Impingement syndrome of left shoulder: Secondary | ICD-10-CM | POA: Diagnosis not present

## 2021-08-08 DIAGNOSIS — M25612 Stiffness of left shoulder, not elsewhere classified: Secondary | ICD-10-CM | POA: Diagnosis not present

## 2021-08-08 DIAGNOSIS — M6281 Muscle weakness (generalized): Secondary | ICD-10-CM | POA: Diagnosis not present

## 2021-08-08 DIAGNOSIS — M25512 Pain in left shoulder: Secondary | ICD-10-CM | POA: Diagnosis not present

## 2021-08-08 DIAGNOSIS — S46012D Strain of muscle(s) and tendon(s) of the rotator cuff of left shoulder, subsequent encounter: Secondary | ICD-10-CM | POA: Diagnosis not present

## 2021-08-13 DIAGNOSIS — M7542 Impingement syndrome of left shoulder: Secondary | ICD-10-CM | POA: Diagnosis not present

## 2021-08-13 DIAGNOSIS — S46112D Strain of muscle, fascia and tendon of long head of biceps, left arm, subsequent encounter: Secondary | ICD-10-CM | POA: Diagnosis not present

## 2021-08-13 DIAGNOSIS — M25512 Pain in left shoulder: Secondary | ICD-10-CM | POA: Diagnosis not present

## 2021-08-13 DIAGNOSIS — S46012D Strain of muscle(s) and tendon(s) of the rotator cuff of left shoulder, subsequent encounter: Secondary | ICD-10-CM | POA: Diagnosis not present

## 2021-08-13 DIAGNOSIS — M25612 Stiffness of left shoulder, not elsewhere classified: Secondary | ICD-10-CM | POA: Diagnosis not present

## 2021-08-13 DIAGNOSIS — M6281 Muscle weakness (generalized): Secondary | ICD-10-CM | POA: Diagnosis not present

## 2021-08-21 DIAGNOSIS — M25612 Stiffness of left shoulder, not elsewhere classified: Secondary | ICD-10-CM | POA: Diagnosis not present

## 2021-08-21 DIAGNOSIS — S46012D Strain of muscle(s) and tendon(s) of the rotator cuff of left shoulder, subsequent encounter: Secondary | ICD-10-CM | POA: Diagnosis not present

## 2021-08-21 DIAGNOSIS — S46112D Strain of muscle, fascia and tendon of long head of biceps, left arm, subsequent encounter: Secondary | ICD-10-CM | POA: Diagnosis not present

## 2021-08-21 DIAGNOSIS — M25512 Pain in left shoulder: Secondary | ICD-10-CM | POA: Diagnosis not present

## 2021-08-21 DIAGNOSIS — M7542 Impingement syndrome of left shoulder: Secondary | ICD-10-CM | POA: Diagnosis not present

## 2021-08-21 DIAGNOSIS — M6281 Muscle weakness (generalized): Secondary | ICD-10-CM | POA: Diagnosis not present

## 2021-08-27 DIAGNOSIS — M7542 Impingement syndrome of left shoulder: Secondary | ICD-10-CM | POA: Diagnosis not present

## 2021-08-27 DIAGNOSIS — M25512 Pain in left shoulder: Secondary | ICD-10-CM | POA: Diagnosis not present

## 2021-08-27 DIAGNOSIS — M25612 Stiffness of left shoulder, not elsewhere classified: Secondary | ICD-10-CM | POA: Diagnosis not present

## 2021-08-27 DIAGNOSIS — S46012D Strain of muscle(s) and tendon(s) of the rotator cuff of left shoulder, subsequent encounter: Secondary | ICD-10-CM | POA: Diagnosis not present

## 2021-08-27 DIAGNOSIS — S46112D Strain of muscle, fascia and tendon of long head of biceps, left arm, subsequent encounter: Secondary | ICD-10-CM | POA: Diagnosis not present

## 2021-08-27 DIAGNOSIS — M6281 Muscle weakness (generalized): Secondary | ICD-10-CM | POA: Diagnosis not present

## 2021-08-29 DIAGNOSIS — S46012D Strain of muscle(s) and tendon(s) of the rotator cuff of left shoulder, subsequent encounter: Secondary | ICD-10-CM | POA: Diagnosis not present

## 2021-08-29 DIAGNOSIS — M25612 Stiffness of left shoulder, not elsewhere classified: Secondary | ICD-10-CM | POA: Diagnosis not present

## 2021-08-29 DIAGNOSIS — M6281 Muscle weakness (generalized): Secondary | ICD-10-CM | POA: Diagnosis not present

## 2021-08-29 DIAGNOSIS — M7542 Impingement syndrome of left shoulder: Secondary | ICD-10-CM | POA: Diagnosis not present

## 2021-08-29 DIAGNOSIS — M25512 Pain in left shoulder: Secondary | ICD-10-CM | POA: Diagnosis not present

## 2021-08-29 DIAGNOSIS — S46112D Strain of muscle, fascia and tendon of long head of biceps, left arm, subsequent encounter: Secondary | ICD-10-CM | POA: Diagnosis not present

## 2021-09-03 DIAGNOSIS — M25612 Stiffness of left shoulder, not elsewhere classified: Secondary | ICD-10-CM | POA: Diagnosis not present

## 2021-09-03 DIAGNOSIS — M6281 Muscle weakness (generalized): Secondary | ICD-10-CM | POA: Diagnosis not present

## 2021-09-03 DIAGNOSIS — M25512 Pain in left shoulder: Secondary | ICD-10-CM | POA: Diagnosis not present

## 2021-09-03 DIAGNOSIS — S46012D Strain of muscle(s) and tendon(s) of the rotator cuff of left shoulder, subsequent encounter: Secondary | ICD-10-CM | POA: Diagnosis not present

## 2021-09-03 DIAGNOSIS — M7542 Impingement syndrome of left shoulder: Secondary | ICD-10-CM | POA: Diagnosis not present

## 2021-09-03 DIAGNOSIS — S46112D Strain of muscle, fascia and tendon of long head of biceps, left arm, subsequent encounter: Secondary | ICD-10-CM | POA: Diagnosis not present

## 2021-09-10 ENCOUNTER — Telehealth: Payer: Self-pay

## 2021-09-10 ENCOUNTER — Other Ambulatory Visit: Payer: Self-pay

## 2021-09-10 DIAGNOSIS — N529 Male erectile dysfunction, unspecified: Secondary | ICD-10-CM

## 2021-09-10 DIAGNOSIS — M6281 Muscle weakness (generalized): Secondary | ICD-10-CM | POA: Diagnosis not present

## 2021-09-10 DIAGNOSIS — S46112D Strain of muscle, fascia and tendon of long head of biceps, left arm, subsequent encounter: Secondary | ICD-10-CM | POA: Diagnosis not present

## 2021-09-10 DIAGNOSIS — S46012D Strain of muscle(s) and tendon(s) of the rotator cuff of left shoulder, subsequent encounter: Secondary | ICD-10-CM | POA: Diagnosis not present

## 2021-09-10 DIAGNOSIS — M25512 Pain in left shoulder: Secondary | ICD-10-CM | POA: Diagnosis not present

## 2021-09-10 DIAGNOSIS — M7542 Impingement syndrome of left shoulder: Secondary | ICD-10-CM | POA: Diagnosis not present

## 2021-09-10 DIAGNOSIS — M25612 Stiffness of left shoulder, not elsewhere classified: Secondary | ICD-10-CM | POA: Diagnosis not present

## 2021-09-10 MED ORDER — SILDENAFIL CITRATE 20 MG PO TABS: 20.0000 mg | ORAL_TABLET | Freq: Every day | ORAL | 0 refills | Status: DC | PRN

## 2021-09-10 NOTE — Telephone Encounter (Signed)
MEDICATION:sildenafil (REVATIO) 20 MG tablet ? ?[place]  ? ?Comments: Patient says it is cheaper for him to go here.  ? ?**Let patient know to contact pharmacy at the end of the day to make sure medication is ready. ** ? ?** Please notify patient to allow 48-72 hours to process** ? ?**Encourage patient to contact the pharmacy for refills or they can request refills through [facility]** ? ? ?

## 2021-09-12 DIAGNOSIS — S46112D Strain of muscle, fascia and tendon of long head of biceps, left arm, subsequent encounter: Secondary | ICD-10-CM | POA: Diagnosis not present

## 2021-09-12 DIAGNOSIS — M6281 Muscle weakness (generalized): Secondary | ICD-10-CM | POA: Diagnosis not present

## 2021-09-12 DIAGNOSIS — M25512 Pain in left shoulder: Secondary | ICD-10-CM | POA: Diagnosis not present

## 2021-09-12 DIAGNOSIS — S46012D Strain of muscle(s) and tendon(s) of the rotator cuff of left shoulder, subsequent encounter: Secondary | ICD-10-CM | POA: Diagnosis not present

## 2021-09-12 DIAGNOSIS — M25612 Stiffness of left shoulder, not elsewhere classified: Secondary | ICD-10-CM | POA: Diagnosis not present

## 2021-09-12 DIAGNOSIS — M7542 Impingement syndrome of left shoulder: Secondary | ICD-10-CM | POA: Diagnosis not present

## 2021-09-17 DIAGNOSIS — M25512 Pain in left shoulder: Secondary | ICD-10-CM | POA: Diagnosis not present

## 2021-09-17 DIAGNOSIS — S46112D Strain of muscle, fascia and tendon of long head of biceps, left arm, subsequent encounter: Secondary | ICD-10-CM | POA: Diagnosis not present

## 2021-09-17 DIAGNOSIS — S46012D Strain of muscle(s) and tendon(s) of the rotator cuff of left shoulder, subsequent encounter: Secondary | ICD-10-CM | POA: Diagnosis not present

## 2021-09-17 DIAGNOSIS — M7542 Impingement syndrome of left shoulder: Secondary | ICD-10-CM | POA: Diagnosis not present

## 2021-09-17 DIAGNOSIS — M25612 Stiffness of left shoulder, not elsewhere classified: Secondary | ICD-10-CM | POA: Diagnosis not present

## 2021-09-17 DIAGNOSIS — M6281 Muscle weakness (generalized): Secondary | ICD-10-CM | POA: Diagnosis not present

## 2021-09-26 DIAGNOSIS — S46012D Strain of muscle(s) and tendon(s) of the rotator cuff of left shoulder, subsequent encounter: Secondary | ICD-10-CM | POA: Diagnosis not present

## 2021-09-26 DIAGNOSIS — M6281 Muscle weakness (generalized): Secondary | ICD-10-CM | POA: Diagnosis not present

## 2021-09-26 DIAGNOSIS — M25612 Stiffness of left shoulder, not elsewhere classified: Secondary | ICD-10-CM | POA: Diagnosis not present

## 2021-09-26 DIAGNOSIS — M7542 Impingement syndrome of left shoulder: Secondary | ICD-10-CM | POA: Diagnosis not present

## 2021-09-26 DIAGNOSIS — S46112D Strain of muscle, fascia and tendon of long head of biceps, left arm, subsequent encounter: Secondary | ICD-10-CM | POA: Diagnosis not present

## 2021-09-26 DIAGNOSIS — M25512 Pain in left shoulder: Secondary | ICD-10-CM | POA: Diagnosis not present

## 2021-10-01 DIAGNOSIS — S46112D Strain of muscle, fascia and tendon of long head of biceps, left arm, subsequent encounter: Secondary | ICD-10-CM | POA: Diagnosis not present

## 2021-10-01 DIAGNOSIS — M25512 Pain in left shoulder: Secondary | ICD-10-CM | POA: Diagnosis not present

## 2021-10-01 DIAGNOSIS — M25612 Stiffness of left shoulder, not elsewhere classified: Secondary | ICD-10-CM | POA: Diagnosis not present

## 2021-10-01 DIAGNOSIS — M6281 Muscle weakness (generalized): Secondary | ICD-10-CM | POA: Diagnosis not present

## 2021-10-01 DIAGNOSIS — M7542 Impingement syndrome of left shoulder: Secondary | ICD-10-CM | POA: Diagnosis not present

## 2021-10-01 DIAGNOSIS — S46012D Strain of muscle(s) and tendon(s) of the rotator cuff of left shoulder, subsequent encounter: Secondary | ICD-10-CM | POA: Diagnosis not present

## 2021-10-08 DIAGNOSIS — M25512 Pain in left shoulder: Secondary | ICD-10-CM | POA: Diagnosis not present

## 2021-10-08 DIAGNOSIS — M6281 Muscle weakness (generalized): Secondary | ICD-10-CM | POA: Diagnosis not present

## 2021-10-08 DIAGNOSIS — M25612 Stiffness of left shoulder, not elsewhere classified: Secondary | ICD-10-CM | POA: Diagnosis not present

## 2021-10-08 DIAGNOSIS — S46112D Strain of muscle, fascia and tendon of long head of biceps, left arm, subsequent encounter: Secondary | ICD-10-CM | POA: Diagnosis not present

## 2021-10-08 DIAGNOSIS — M7542 Impingement syndrome of left shoulder: Secondary | ICD-10-CM | POA: Diagnosis not present

## 2021-10-08 DIAGNOSIS — S46012D Strain of muscle(s) and tendon(s) of the rotator cuff of left shoulder, subsequent encounter: Secondary | ICD-10-CM | POA: Diagnosis not present

## 2021-10-10 DIAGNOSIS — M25612 Stiffness of left shoulder, not elsewhere classified: Secondary | ICD-10-CM | POA: Diagnosis not present

## 2021-10-10 DIAGNOSIS — M25512 Pain in left shoulder: Secondary | ICD-10-CM | POA: Diagnosis not present

## 2021-10-10 DIAGNOSIS — S46112D Strain of muscle, fascia and tendon of long head of biceps, left arm, subsequent encounter: Secondary | ICD-10-CM | POA: Diagnosis not present

## 2021-10-10 DIAGNOSIS — S46012D Strain of muscle(s) and tendon(s) of the rotator cuff of left shoulder, subsequent encounter: Secondary | ICD-10-CM | POA: Diagnosis not present

## 2021-10-10 DIAGNOSIS — M7542 Impingement syndrome of left shoulder: Secondary | ICD-10-CM | POA: Diagnosis not present

## 2021-10-10 DIAGNOSIS — M6281 Muscle weakness (generalized): Secondary | ICD-10-CM | POA: Diagnosis not present

## 2021-10-15 DIAGNOSIS — M25512 Pain in left shoulder: Secondary | ICD-10-CM | POA: Diagnosis not present

## 2021-10-23 DIAGNOSIS — M25512 Pain in left shoulder: Secondary | ICD-10-CM | POA: Diagnosis not present

## 2021-10-23 DIAGNOSIS — M6281 Muscle weakness (generalized): Secondary | ICD-10-CM | POA: Diagnosis not present

## 2021-10-23 DIAGNOSIS — M25612 Stiffness of left shoulder, not elsewhere classified: Secondary | ICD-10-CM | POA: Diagnosis not present

## 2021-10-23 DIAGNOSIS — S46112D Strain of muscle, fascia and tendon of long head of biceps, left arm, subsequent encounter: Secondary | ICD-10-CM | POA: Diagnosis not present

## 2021-10-23 DIAGNOSIS — S46012D Strain of muscle(s) and tendon(s) of the rotator cuff of left shoulder, subsequent encounter: Secondary | ICD-10-CM | POA: Diagnosis not present

## 2021-10-23 DIAGNOSIS — M7542 Impingement syndrome of left shoulder: Secondary | ICD-10-CM | POA: Diagnosis not present

## 2021-11-25 ENCOUNTER — Telehealth: Payer: Self-pay | Admitting: Dermatology

## 2021-11-25 NOTE — Telephone Encounter (Signed)
Brian Mcguire, [name], wants to know what he should do/where he should go. Says you know him and his doctor brother and wants to talk to you

## 2021-12-04 ENCOUNTER — Ambulatory Visit: Payer: PPO | Admitting: Family Medicine

## 2021-12-23 ENCOUNTER — Other Ambulatory Visit: Payer: Self-pay | Admitting: Family Medicine

## 2021-12-23 DIAGNOSIS — N529 Male erectile dysfunction, unspecified: Secondary | ICD-10-CM

## 2021-12-23 NOTE — Telephone Encounter (Signed)
Patient is requesting a refill of the following medications: Requested Prescriptions   Pending Prescriptions Disp Refills   sildenafil (REVATIO) 20 MG tablet [Pharmacy Med Name: SILDENAFIL 20 MG TABLET] 45 tablet 1    Sig: TAKE 1 TABLET BY MOUTH EVERY DAY AS NEEDED    Date of patient request: 12/23/2021 Last office visit: 07/11/2021 Date of last refill: 09/10/2021 Last refill amount: 90 tablets  Follow up time period per chart: 01/10/2022

## 2021-12-23 NOTE — Telephone Encounter (Signed)
Medication discussed earlier this year.  Refill ordered.

## 2022-01-10 ENCOUNTER — Ambulatory Visit: Payer: PPO | Admitting: Family Medicine

## 2022-01-15 ENCOUNTER — Encounter: Payer: Self-pay | Admitting: Family Medicine

## 2022-01-15 ENCOUNTER — Ambulatory Visit (INDEPENDENT_AMBULATORY_CARE_PROVIDER_SITE_OTHER): Payer: PPO | Admitting: Family Medicine

## 2022-01-15 VITALS — BP 140/70 | HR 72 | Temp 99.0°F | Ht 71.0 in | Wt 231.0 lb

## 2022-01-15 DIAGNOSIS — I10 Essential (primary) hypertension: Secondary | ICD-10-CM | POA: Diagnosis not present

## 2022-01-15 DIAGNOSIS — E785 Hyperlipidemia, unspecified: Secondary | ICD-10-CM | POA: Diagnosis not present

## 2022-01-15 DIAGNOSIS — Z131 Encounter for screening for diabetes mellitus: Secondary | ICD-10-CM

## 2022-01-15 DIAGNOSIS — N529 Male erectile dysfunction, unspecified: Secondary | ICD-10-CM

## 2022-01-15 LAB — COMPREHENSIVE METABOLIC PANEL
ALT: 16 U/L (ref 0–53)
AST: 19 U/L (ref 0–37)
Albumin: 4.4 g/dL (ref 3.5–5.2)
Alkaline Phosphatase: 48 U/L (ref 39–117)
BUN: 16 mg/dL (ref 6–23)
CO2: 29 mEq/L (ref 19–32)
Calcium: 9.7 mg/dL (ref 8.4–10.5)
Chloride: 98 mEq/L (ref 96–112)
Creatinine, Ser: 0.95 mg/dL (ref 0.40–1.50)
GFR: 80.59 mL/min (ref >60.00)
Glucose, Bld: 83 mg/dL (ref 70–99)
Potassium: 4 mEq/L (ref 3.5–5.1)
Sodium: 137 mEq/L (ref 135–145)
Total Bilirubin: 1.5 mg/dL — ABNORMAL HIGH (ref 0.2–1.2)
Total Protein: 7.3 g/dL (ref 6.0–8.3)

## 2022-01-15 LAB — LIPID PANEL
Cholesterol: 178 mg/dL (ref 0–200)
HDL: 52.5 mg/dL (ref >39.00)
LDL Cholesterol: 113 mg/dL — ABNORMAL HIGH (ref 0–99)
NonHDL: 125.59
Total CHOL/HDL Ratio: 3
Triglycerides: 65 mg/dL (ref 0.0–149.0)
VLDL: 13 mg/dL (ref 0.0–40.0)

## 2022-01-15 LAB — HEMOGLOBIN A1C: Hgb A1c MFr Bld: 5.3 % (ref 4.6–6.5)

## 2022-01-15 MED ORDER — SILDENAFIL CITRATE 20 MG PO TABS: 20.0000 mg | ORAL_TABLET | Freq: Every day | ORAL | 1 refills | Status: DC | PRN

## 2022-01-15 MED ORDER — AMLODIPINE BESYLATE 5 MG PO TABS: 5.0000 mg | ORAL_TABLET | Freq: Every day | ORAL | 1 refills | Status: DC

## 2022-01-15 MED ORDER — LISINOPRIL-HYDROCHLOROTHIAZIDE 20-25 MG PO TABS: 1.0000 | ORAL_TABLET | Freq: Every day | ORAL | 1 refills | Status: DC

## 2022-01-15 NOTE — Progress Notes (Unsigned)
Subjective:  Patient ID: Brian Mcguire, male    DOB: 1950/08/11  Age: 71 y.o. MRN: [id number]  CC:  Chief Complaint  Patient presents with   Hypertension    Pt states all is well   Hyperlipidemia    HPI Brian Mcguire presents for   Hypertension: Amlodipine '5mg'$  qd, lisinopril hct 20/'25mg'$  QD.  Home readings: none.  No new side effects.  Less alcohol than in past. Some weeks none - up to a few per week or once per day.  BP Readings from Last 3 Encounters:  01/15/22 (!) 140/80  07/11/21 134/76  06/06/21 122/68   Lab Results  Component Value Date   CREATININE 1.04 07/11/2021   Erectile dysfunction: Some relief with intermittent '20mg'$  dose sildenafil, no HA/flushing. No vision/hearing changes or CP with exertion.   Hyperlipidemia: No current meds.  Plan for continued diet/exercise and weight loss approach.  Was improving last visit. Weight has worsened, some dietary indiscretion,  but plans on making some diet and activity changes moving forward.  Goal of weight 210.  Wants to follow up in few months for repeat labs.    Wt Readings from Last 3 Encounters:  01/15/22 231 lb (104.8 kg)  07/11/21 221 lb 12.8 oz (100.6 kg)  06/06/21 232 lb 9.6 oz (105.5 kg)    The 10-year ASCVD risk score (Arnett DK, et al., 2019) is: 24.9%   Values used to calculate the score:     Age: 66 years     Sex: Male     Is Non-Hispanic African American: No     Diabetic: No     Tobacco smoker: No     Systolic Blood Pressure: 379 mmHg     Is BP treated: Yes     HDL Cholesterol: 48.1 mg/dL     Total Cholesterol: 181 mg/dL  Lab Results  Component Value Date   CHOL 181 07/11/2021   HDL 48.10 07/11/2021   LDLCALC 117 (H) 07/11/2021   TRIG 79.0 07/11/2021   CHOLHDL 4 07/11/2021   Lab Results  Component Value Date   ALT 12 07/11/2021   AST 14 07/11/2021   ALKPHOS 44 07/11/2021   BILITOT 0.9 07/11/2021    Declines flu vaccine.    History Patient Active Problem List   Diagnosis  Date Noted   Substance dependency ([place]) 12/03/2015   No past medical history on file. Past Surgical History:  Procedure Laterality Date   CHOLECYSTECTOMY     COLON SURGERY     ROTATOR CUFF REPAIR Left 06/17/2021   No Known Allergies Prior to Admission medications   Medication Sig Start Date End Date Taking? Authorizing Provider  amLODipine (NORVASC) 5 MG tablet Take 1 tablet (5 mg total) by mouth daily. 06/06/21  Yes Wendie Agreste, MD  lisinopril-hydrochlorothiazide (ZESTORETIC) 20-25 MG tablet Take 1 tablet by mouth daily. 06/06/21  Yes Wendie Agreste, MD  sildenafil (REVATIO) 20 MG tablet TAKE 1 TABLET BY MOUTH EVERY DAY AS NEEDED 12/23/21  Yes Wendie Agreste, MD   Social History   Socioeconomic History   Marital status: Divorced    Spouse name: Not on file   Number of children: 2   Years of education: Not on file   Highest education level: Some college, no degree  Occupational History   Not on file  Tobacco Use   Smoking status: Never   Smokeless tobacco: Never  Vaping Use   Vaping Use: Never used  Substance and Sexual Activity  Alcohol use: Yes    Alcohol/week: 3.0 standard drinks of alcohol    Types: 1 Glasses of wine, 1 Cans of beer, 1 Shots of liquor per week    Comment: pt notes 3-4 drinks a week   Drug use: No   Sexual activity: Never  Other Topics Concern   Not on file  Social History Narrative   Not on file   Social Determinants of Health   Financial Resource Strain: Low Risk  (07/12/2018)   Overall Financial Resource Strain (CARDIA)    Difficulty of Paying Living Expenses: Not hard at all  Food Insecurity: No Food Insecurity (07/12/2018)   Hunger Vital Sign    Worried About Running Out of Food in the Last Year: Never true    Ran Out of Food in the Last Year: Never true  Transportation Needs: No Transportation Needs (07/12/2018)   PRAPARE - Hydrologist (Medical): No    Lack of Transportation (Non-Medical): No  Physical  Activity: Inactive (07/12/2018)   Exercise Vital Sign    Days of Exercise per Week: 0 days    Minutes of Exercise per Session: 0 min  Stress: No Stress Concern Present (07/12/2018)   [place]    Feeling of Stress : Not at all  Social Connections: Somewhat Isolated (07/12/2018)   Social Connection and Isolation Panel [NHANES]    Frequency of Communication with Friends and Family: More than three times a week    Frequency of Social Gatherings with Friends and Family: More than three times a week    Attends Religious Services: Never    Marine scientist or Organizations: Yes    Attends Music therapist: More than 4 times per year    Marital Status: Divorced  Intimate Partner Violence: Not At Risk (07/12/2018)   Humiliation, Afraid, Rape, and Kick questionnaire    Fear of Current or Ex-Partner: No    Emotionally Abused: No    Physically Abused: No    Sexually Abused: No    Review of Systems  Constitutional:  Negative for fatigue and unexpected weight change.  Eyes:  Negative for visual disturbance.  Respiratory:  Negative for cough, chest tightness and shortness of breath.   Cardiovascular:  Negative for chest pain, palpitations and leg swelling.  Gastrointestinal:  Negative for abdominal pain and blood in stool.  Neurological:  Negative for dizziness, light-headedness and headaches.     Objective:   Vitals:   01/15/22 1059  BP: (!) 140/80  Pulse: 72  Temp: 99 F (37.2 C)  SpO2: 99%  Weight: 231 lb (104.8 kg)  Height: '5\' 11"'$  (1.803 m)     Physical Exam Vitals reviewed.  Constitutional:      Appearance: He is well-developed.  HENT:     Head: Normocephalic and atraumatic.  Neck:     Vascular: No carotid bruit or JVD.  Cardiovascular:     Rate and Rhythm: Normal rate and regular rhythm.     Heart sounds: Normal heart sounds. No murmur heard. Pulmonary:     Effort: Pulmonary effort is  normal.     Breath sounds: Normal breath sounds. No rales.  Musculoskeletal:     Right lower leg: No edema.     Left lower leg: No edema.  Skin:    General: Skin is warm and dry.  Neurological:     Mental Status: He is alert and oriented to person, place, and  time.  Psychiatric:        Mood and Affect: Mood normal.        Assessment & Plan:  Brian Mcguire is a 71 y.o. male . Essential hypertension  Hyperlipidemia, unspecified hyperlipidemia type  Screening for diabetes mellitus  Erectile dysfunction, unspecified erectile dysfunction type   No orders of the defined types were placed in this encounter.  There are no Patient Instructions on file for this visit.    Signed,   [name], MD [place] 01/15/22 11:40 AM

## 2022-01-15 NOTE — Patient Instructions (Addendum)
I will check cholesterol levels, but improved and diet and activity should help. I would consider the coronary calcium scoring test to help decide on statin medicine. Let me know if you would like to have that done.   You may be able to schedule the CT Cardiac scoring test on your own.  This test is a self pay test that insurance doesn't cover. You can call to get this scheduled yourself at [phone]. They will be able to tell you the cost and get you set up for a time that works best for you.   No med changes for now.

## 2022-01-16 DIAGNOSIS — M25512 Pain in left shoulder: Secondary | ICD-10-CM | POA: Diagnosis not present

## 2022-02-18 ENCOUNTER — Other Ambulatory Visit: Payer: Self-pay | Admitting: Family Medicine

## 2022-02-18 DIAGNOSIS — I10 Essential (primary) hypertension: Secondary | ICD-10-CM

## 2022-04-07 ENCOUNTER — Telehealth: Payer: Self-pay | Admitting: Family Medicine

## 2022-04-07 ENCOUNTER — Ambulatory Visit: Payer: PPO | Admitting: Family Medicine

## 2022-04-07 NOTE — Telephone Encounter (Signed)
She can establish w/ Di Kindle next month when she arrives.  My panel is full.

## 2022-04-07 NOTE — Telephone Encounter (Signed)
Patient of Dr Carlota Raspberry wants you to see his girlfriend,  okay to est?

## 2022-04-07 NOTE — Telephone Encounter (Signed)
Caller name: DEVAUGHN SAVANT  On DPR?: Yes  Call back number: [phone] (mobile)  Provider they see: Wendie Agreste, MD  Reason for call:  Pt called to get his girlfriend Sheran Spine) est at this office. Girlfriend wants to see a male dr. Abbott Pao knows that Dr.Tabori panel is full but wants to know if Dr.Tabori take her as NP. Is this okay to est?

## 2022-04-07 NOTE — Telephone Encounter (Signed)
Called patient LM and informed him his girlfriend could schedule with Nurse practitioner in the new year

## 2022-04-22 DIAGNOSIS — M1712 Unilateral primary osteoarthritis, left knee: Secondary | ICD-10-CM | POA: Diagnosis not present

## 2022-04-23 ENCOUNTER — Ambulatory Visit: Payer: PPO | Admitting: Family Medicine

## 2022-04-29 DIAGNOSIS — M1712 Unilateral primary osteoarthritis, left knee: Secondary | ICD-10-CM | POA: Diagnosis not present

## 2022-05-06 DIAGNOSIS — M1712 Unilateral primary osteoarthritis, left knee: Secondary | ICD-10-CM | POA: Diagnosis not present

## 2022-05-08 DIAGNOSIS — D120 Benign neoplasm of cecum: Secondary | ICD-10-CM | POA: Diagnosis not present

## 2022-05-08 DIAGNOSIS — K5730 Diverticulosis of large intestine without perforation or abscess without bleeding: Secondary | ICD-10-CM | POA: Diagnosis not present

## 2022-05-08 DIAGNOSIS — Z86010 Personal history of colonic polyps: Secondary | ICD-10-CM | POA: Diagnosis not present

## 2022-05-08 DIAGNOSIS — Z09 Encounter for follow-up examination after completed treatment for conditions other than malignant neoplasm: Secondary | ICD-10-CM | POA: Diagnosis not present

## 2022-05-08 DIAGNOSIS — K648 Other hemorrhoids: Secondary | ICD-10-CM | POA: Diagnosis not present

## 2022-05-08 DIAGNOSIS — D123 Benign neoplasm of transverse colon: Secondary | ICD-10-CM | POA: Diagnosis not present

## 2022-05-12 DIAGNOSIS — D120 Benign neoplasm of cecum: Secondary | ICD-10-CM | POA: Diagnosis not present

## 2022-05-12 DIAGNOSIS — D123 Benign neoplasm of transverse colon: Secondary | ICD-10-CM | POA: Diagnosis not present

## 2022-05-26 ENCOUNTER — Telehealth: Payer: Self-pay | Admitting: Family Medicine

## 2022-05-26 ENCOUNTER — Other Ambulatory Visit: Payer: Self-pay

## 2022-05-26 DIAGNOSIS — N529 Male erectile dysfunction, unspecified: Secondary | ICD-10-CM

## 2022-05-26 MED ORDER — SILDENAFIL CITRATE 20 MG PO TABS: 20.0000 mg | ORAL_TABLET | Freq: Every day | ORAL | 1 refills | Status: DC | PRN

## 2022-05-26 NOTE — Telephone Encounter (Signed)
Sent and pt informed  

## 2022-05-26 NOTE — Telephone Encounter (Signed)
Encourage patient to contact the pharmacy for refills or they can request refills through [facility] TO:  [place], [place] AT [place]:  Sildenfil (Revatio) 20 Mg     NOTES/COMMENTS FROM PATIENT:      [place] office please notify patient: It takes 48-72 hours to process rx refill requests Ask patient to call pharmacy to ensure rx is ready before heading there.

## 2022-05-28 NOTE — Telephone Encounter (Signed)
error 

## 2022-06-05 DIAGNOSIS — S0011XA Contusion of right eyelid and periocular area, initial encounter: Secondary | ICD-10-CM | POA: Diagnosis not present

## 2022-06-25 ENCOUNTER — Ambulatory Visit: Payer: PPO | Admitting: Family Medicine

## 2022-06-26 DIAGNOSIS — H9313 Tinnitus, bilateral: Secondary | ICD-10-CM | POA: Diagnosis not present

## 2022-06-26 DIAGNOSIS — H903 Sensorineural hearing loss, bilateral: Secondary | ICD-10-CM | POA: Diagnosis not present

## 2022-07-01 DIAGNOSIS — M1712 Unilateral primary osteoarthritis, left knee: Secondary | ICD-10-CM | POA: Diagnosis not present

## 2022-07-03 DIAGNOSIS — M1712 Unilateral primary osteoarthritis, left knee: Secondary | ICD-10-CM | POA: Diagnosis not present

## 2022-07-14 ENCOUNTER — Other Ambulatory Visit: Payer: Self-pay

## 2022-07-14 ENCOUNTER — Telehealth: Payer: Self-pay | Admitting: Family Medicine

## 2022-07-14 DIAGNOSIS — I10 Essential (primary) hypertension: Secondary | ICD-10-CM

## 2022-07-14 MED ORDER — LISINOPRIL-HYDROCHLOROTHIAZIDE 20-25 MG PO TABS: 1.0000 | ORAL_TABLET | Freq: Every day | ORAL | 1 refills | Status: DC

## 2022-07-14 NOTE — Telephone Encounter (Signed)
Encourage patient to contact the pharmacy for refills or they can request refills through [facility] TO: [place], [place] AT [place]:  lisinopril-hydrochlorothiazide (ZESTORETIC) 20-25 MG tablet  NOTES/COMMENTS FROM PATIENT:      [place] office please notify patient: It takes 48-72 hours to process rx refill requests Ask patient to call pharmacy to ensure rx is ready before heading there.

## 2022-07-23 ENCOUNTER — Ambulatory Visit: Payer: PPO | Admitting: Family Medicine

## 2022-08-06 ENCOUNTER — Telehealth: Payer: Self-pay | Admitting: Family Medicine

## 2022-08-06 NOTE — Telephone Encounter (Signed)
Contacted BRAUN BARNO to schedule their annual wellness visit. Appointment made for 08/13/2022.  Thank you,  [place] Direct dial  [phone]

## 2022-08-12 DIAGNOSIS — H25013 Cortical age-related cataract, bilateral: Secondary | ICD-10-CM | POA: Diagnosis not present

## 2022-08-12 DIAGNOSIS — H2513 Age-related nuclear cataract, bilateral: Secondary | ICD-10-CM | POA: Diagnosis not present

## 2022-08-12 DIAGNOSIS — H524 Presbyopia: Secondary | ICD-10-CM | POA: Diagnosis not present

## 2022-08-12 DIAGNOSIS — H5213 Myopia, bilateral: Secondary | ICD-10-CM | POA: Diagnosis not present

## 2022-08-12 DIAGNOSIS — H52203 Unspecified astigmatism, bilateral: Secondary | ICD-10-CM | POA: Diagnosis not present

## 2022-08-13 ENCOUNTER — Ambulatory Visit (INDEPENDENT_AMBULATORY_CARE_PROVIDER_SITE_OTHER): Payer: PPO | Admitting: *Deleted

## 2022-08-13 DIAGNOSIS — Z0000 Encounter for general adult medical examination without abnormal findings: Secondary | ICD-10-CM | POA: Diagnosis not present

## 2022-08-13 NOTE — Progress Notes (Signed)
Subjective:   Brian Mcguire is a 72 y.o. male who presents for Medicare Annual/Subsequent preventive examination.  I connected with  Brian Mcguire on 08/13/22 by a telephone enabled telemedicine application and verified that I am speaking with the correct person using two identifiers.   I discussed the limitations of evaluation and management by telemedicine. The patient expressed understanding and agreed to proceed.  Patient location: home  Provider location:  home/telephone     Review of Systems     Cardiac Risk Factors include: advanced age (>74men, >24 women);male gender;family history of premature cardiovascular disease     Objective:    Today's Vitals   There is no height or weight on file to calculate BMI.     08/13/2022    2:10 PM 07/11/2021    8:32 AM 07/03/2020    7:41 PM 09/29/2019    2:39 PM 07/12/2018    2:12 PM 03/23/2017    1:53 PM  Advanced Directives  Does Patient Have a Medical Advance Directive? No No No Yes No No  Type of Advance Directive    Living will    Does patient want to make changes to medical advance directive?    Yes (Inpatient - patient defers changing a medical advance directive at this time - Information given)    Would patient like information on creating a medical advance directive? No - Patient declined Yes (MAU/Ambulatory/Procedural Areas - Information given)   Yes (ED - Information included in AVS) Yes (MAU/Ambulatory/Procedural Areas - Information given)    Current Medications (verified) Outpatient Encounter Medications as of 08/13/2022  Medication Sig   amLODipine (NORVASC) 5 MG tablet TAKE 1 TABLET(5 MG) BY MOUTH DAILY   lisinopril-hydrochlorothiazide (ZESTORETIC) 20-25 MG tablet Take 1 tablet by mouth daily.   sildenafil (REVATIO) 20 MG tablet Take 1 tablet (20 mg total) by mouth daily as needed.   No facility-administered encounter medications on file as of 08/13/2022.    Allergies (verified) Patient has no known allergies.    History: History reviewed. No pertinent past medical history. Past Surgical History:  Procedure Laterality Date   CHOLECYSTECTOMY     COLON SURGERY     ROTATOR CUFF REPAIR Left 06/17/2021   Family History  Problem Relation Age of Onset   Cancer Father    Hypertension Father    Heart disease Father    Heart disease Maternal Grandfather    Social History   Socioeconomic History   Marital status: Divorced    Spouse name: Not on file   Number of children: 2   Years of education: Not on file   Highest education level: Some college, no degree  Occupational History   Not on file  Tobacco Use   Smoking status: Never   Smokeless tobacco: Never  Vaping Use   Vaping Use: Never used  Substance and Sexual Activity   Alcohol use: Yes    Alcohol/week: 3.0 standard drinks of alcohol    Types: 1 Glasses of wine, 1 Cans of beer, 1 Shots of liquor per week    Comment: pt notes 3-4 drinks a week   Drug use: No   Sexual activity: Never  Other Topics Concern   Not on file  Social History Narrative   Not on file   Social Determinants of Health   Financial Resource Strain: Low Risk  (08/13/2022)   Overall Financial Resource Strain (CARDIA)    Difficulty of Paying Living Expenses: Not hard at all  Food Insecurity: No  Food Insecurity (08/13/2022)   Hunger Vital Sign    Worried About Running Out of Food in the Last Year: Never true    Ran Out of Food in the Last Year: Never true  Transportation Needs: No Transportation Needs (08/13/2022)   PRAPARE - Administrator, Civil Service (Medical): No    Lack of Transportation (Non-Medical): No  Physical Activity: Insufficiently Active (08/13/2022)   Exercise Vital Sign    Days of Exercise per Week: 3 days    Minutes of Exercise per Session: 30 min  Stress: No Stress Concern Present (08/13/2022)   Brian Mcguire [employer] Occupational Health - Occupational Stress Questionnaire    Feeling of Stress : Not at all  Social Connections:  Moderately Integrated (08/13/2022)   Social Connection and Isolation Panel [NHANES]    Frequency of Communication with Friends and Family: More than three times a week    Frequency of Social Gatherings with Friends and Family: More than three times a week    Attends Religious Services: Never    Database administrator or Organizations: Yes    Attends Engineer, structural: More than 4 times per year    Marital Status: Living with partner    Tobacco Counseling Counseling given: Not Answered   Clinical Intake:  Pre-visit preparation completed: Yes  Pain : No/denies pain     Diabetes: No  How often do you need to have someone help you when you read instructions, pamphlets, or other written materials from your doctor or pharmacy?: 1 - Never  Diabetic?   no  Interpreter Needed?: No  Information entered by :: Remi Haggard LPN   Activities of Daily Living    08/13/2022    2:23 PM 08/13/2022    2:11 PM  In your present state of health, do you have any difficulty performing the following activities:  Hearing? 1 1  Vision? 0 0  Difficulty concentrating or making decisions? 0 0  Walking or climbing stairs? 0   Dressing or bathing? 0   Doing errands, shopping? 0 0  Preparing Food and eating ? N N  Using the Toilet? N N  In the past six months, have you accidently leaked urine? N N  Do you have problems with loss of bowel control? N N  Managing your Medications? N N  Managing your Finances? N N  Housekeeping or managing your Housekeeping? N N    Patient Care Team: Shade Flood, MD as PCP - General (Family Medicine) Jethro Bolus, MD as Consulting Physician (Ophthalmology) Janalyn Harder, MD (Inactive) as Consulting Physician (Dermatology) Vida Rigger, MD as Consulting Physician (Gastroenterology) Christia Reading, MD as Consulting Physician (Otolaryngology)  Indicate any recent Medical Services you may have received from other than [facility] providers in the past year  (date may be approximate).     Assessment:   This is a routine wellness examination for Brian Mcguire.  Hearing/Vision screen Hearing Screening - Comments:: Some hearing loss Has been to audiologist No hearing aids Vision Screening - Comments:: Up to date [facility]  Dietary issues and exercise activities discussed: Current Exercise Habits: Home exercise routine, Type of exercise: walking, Time (Minutes): 25, Frequency (Times/Week): 3, Weekly Exercise (Minutes/Week): 75, Intensity: Mild   Goals Addressed             This Visit's Progress    Weight (lb) < 200 lb (90.7 kg)         Depression Screen    08/13/2022    2:15  PM 01/15/2022   10:58 AM 07/11/2021    8:04 AM 06/06/2021   10:34 AM 01/09/2021    4:02 PM 11/02/2019   11:49 AM 09/29/2019    2:37 PM  PHQ 2/9 Scores  PHQ - 2 Score 0 0 0 0 0 0 0  PHQ- 9 Score 0 0   0      Fall Risk    08/13/2022    2:08 PM 01/15/2022   10:58 AM 07/11/2021    8:04 AM 06/06/2021   10:34 AM 01/09/2021    4:02 PM  Fall Risk   Falls in the past year? 0 0 1 1 1   Number falls in past yr: 0 0 0 0 0  Injury with Fall? 0 0 1 1 1   Comment    rotstor repair 06/10/21   Risk for fall due to :  No Fall Risks History of fall(s);No Fall Risks No Fall Risks;History of fall(s) History of fall(s)  Follow up Falls evaluation completed;Education provided;Falls prevention discussed Falls evaluation completed Falls evaluation completed Falls evaluation completed Falls evaluation completed    FALL RISK PREVENTION PERTAINING TO THE HOME:  Any stairs in or around the home? Yes  If so, are there any without handrails? No  Home free of loose throw rugs in walkways, pet beds, electrical cords, etc? Yes  Adequate lighting in your home to reduce risk of falls? Yes   ASSISTIVE DEVICES UTILIZED TO PREVENT FALLS:  Life alert? No  Use of a cane, walker or w/c? No  Grab bars in the bathroom? No  Shower chair or bench in shower? No  Elevated toilet seat or a handicapped toilet?  No   TIMED UP AND GO:  Was the test performed? No .    Cognitive Function:        08/13/2022    2:12 PM 07/11/2021    8:04 AM 09/29/2019    2:33 PM 07/12/2018    2:02 PM 03/23/2017    2:03 PM  6CIT Screen  What Year? 0 points 0 points 0 points 0 points 0 points  What month? 0 points 0 points 0 points 0 points 0 points  What time? 0 points 0 points 0 points 0 points 0 points  Count back from 20 0 points 0 points 0 points 0 points 0 points  Months in reverse 2 points 0 points 0 points 0 points 0 points  Repeat phrase 0 points 0 points 0 points 0 points 2 points  Total Score 2 points 0 points 0 points 0 points 2 points    Immunizations Immunization History  Administered Date(s) Administered   Pneumococcal Conjugate-13 12/03/2015   Tdap 07/03/2020    TDAP status: Up to date  Flu Vaccine status: Declined, Education has been provided regarding the importance of this vaccine but patient still declined. Advised may receive this vaccine at local pharmacy or Health Dept. Aware to provide a copy of the vaccination record if obtained from local pharmacy or Health Dept. Verbalized acceptance and understanding.  Pneumococcal vaccine status: Due, Education has been provided regarding the importance of this vaccine. Advised may receive this vaccine at local pharmacy or Health Dept. Aware to provide a copy of the vaccination record if obtained from local pharmacy or Health Dept. Verbalized acceptance and understanding.  Covid-19 vaccine status: Declined, Education has been provided regarding the importance of this vaccine but patient still declined. Advised may receive this vaccine at local pharmacy or Health Dept.or vaccine clinic. Aware to provide a  copy of the vaccination record if obtained from local pharmacy or Health Dept. Verbalized acceptance and understanding.  Qualifies for Shingles Vaccine? Yes   Zostavax completed No   Shingrix Completed?: No.    Education has been provided regarding  the importance of this vaccine. Patient has been advised to call insurance company to determine out of pocket expense if they have not yet received this vaccine. Advised may also receive vaccine at local pharmacy or Health Dept. Verbalized acceptance and understanding.  Screening Tests Health Maintenance  Topic Date Due   COVID-19 Vaccine (1) 08/29/2022 (Originally 08/20/1955)   Zoster Vaccines- Shingrix (1 of 2) 11/12/2022 (Originally 08/19/1969)   Pneumonia Vaccine 70+ Years old (2 of 2 - PPSV23 or PCV20) 08/13/2023 (Originally 12/02/2016)   INFLUENZA VACCINE  12/04/2022   Medicare Annual Wellness (AWV)  08/13/2023   COLONOSCOPY (Pts 45-34yrs Insurance coverage will need to be confirmed)  11/25/2025   DTaP/Tdap/Td (2 - Td or Tdap) 07/04/2030   Hepatitis C Screening  Completed   HPV VACCINES  Aged Out    Health Maintenance  There are no preventive care reminders to display for this patient.   Colorectal cancer screening: Type of screening: Colonoscopy. Completed 2024. Repeat every 0 years  Lung Cancer Screening: (Low Dose CT Chest recommended if Age 42-80 years, 30 pack-year currently smoking OR have quit w/in 15years.) does not qualify.   Lung Cancer Screening Referral:   Additional Screening:  Hepatitis C Screening: does not qualify; Completed 2017  Vision Screening: Recommended annual ophthalmology exams for early detection of glaucoma and other disorders of the eye. Is the patient up to date with their annual eye exam?  Yes  Who is the provider or what is the name of the office in which the patient attends annual eye exams? [facility] If pt is not established with a provider, would they like to be referred to a provider to establish care? No .   Dental Screening: Recommended annual dental exams for proper oral hygiene  Community Resource Referral / Chronic Care Management: CRR required this visit?  No   CCM required this visit?  No      Plan:     I have personally  reviewed and noted the following in the patient's chart:   Medical and social history Use of alcohol, tobacco or illicit drugs  Current medications and supplements including opioid prescriptions. Patient is not currently taking opioid prescriptions. Functional ability and status Nutritional status Physical activity Advanced directives List of other physicians Hospitalizations, surgeries, and ER visits in previous 12 months Vitals Screenings to include cognitive, depression, and falls Referrals and appointments  In addition, I have reviewed and discussed with patient certain preventive protocols, quality metrics, and best practice recommendations. A written personalized care plan for preventive services as well as general preventive health recommendations were provided to patient.     Remi Haggard, LPN   06/14/4707   Nurse Notes:

## 2022-08-25 ENCOUNTER — Encounter: Payer: Self-pay | Admitting: Family Medicine

## 2022-08-25 ENCOUNTER — Ambulatory Visit (INDEPENDENT_AMBULATORY_CARE_PROVIDER_SITE_OTHER): Payer: PPO | Admitting: Family Medicine

## 2022-08-25 VITALS — BP 122/78 | HR 73 | Temp 97.8°F | Ht 71.0 in | Wt 241.0 lb

## 2022-08-25 DIAGNOSIS — K219 Gastro-esophageal reflux disease without esophagitis: Secondary | ICD-10-CM | POA: Diagnosis not present

## 2022-08-25 DIAGNOSIS — I10 Essential (primary) hypertension: Secondary | ICD-10-CM

## 2022-08-25 DIAGNOSIS — E785 Hyperlipidemia, unspecified: Secondary | ICD-10-CM | POA: Diagnosis not present

## 2022-08-25 DIAGNOSIS — N529 Male erectile dysfunction, unspecified: Secondary | ICD-10-CM

## 2022-08-25 LAB — LIPID PANEL
Cholesterol: 182 mg/dL (ref 0–200)
HDL: 35.4 mg/dL — ABNORMAL LOW (ref >39.00)
LDL Cholesterol: 130 mg/dL — ABNORMAL HIGH (ref 0–99)
NonHDL: 146.79
Total CHOL/HDL Ratio: 5
Triglycerides: 85 mg/dL (ref 0.0–149.0)
VLDL: 17 mg/dL (ref 0.0–40.0)

## 2022-08-25 LAB — COMPREHENSIVE METABOLIC PANEL
ALT: 15 U/L (ref 0–53)
AST: 20 U/L (ref 0–37)
Albumin: 4.6 g/dL (ref 3.5–5.2)
Alkaline Phosphatase: 43 U/L (ref 39–117)
BUN: 16 mg/dL (ref 6–23)
CO2: 29 mEq/L (ref 19–32)
Calcium: 9.9 mg/dL (ref 8.4–10.5)
Chloride: 99 mEq/L (ref 96–112)
Creatinine, Ser: 1.11 mg/dL (ref 0.40–1.50)
GFR: 66.57 mL/min (ref >60.00)
Glucose, Bld: 94 mg/dL (ref 70–99)
Potassium: 4.4 mEq/L (ref 3.5–5.1)
Sodium: 136 mEq/L (ref 135–145)
Total Bilirubin: 1 mg/dL (ref 0.2–1.2)
Total Protein: 7.4 g/dL (ref 6.0–8.3)

## 2022-08-25 MED ORDER — LISINOPRIL-HYDROCHLOROTHIAZIDE 20-25 MG PO TABS: 1.0000 | ORAL_TABLET | Freq: Every day | ORAL | 1 refills | Status: DC

## 2022-08-25 MED ORDER — SILDENAFIL CITRATE 20 MG PO TABS: 20.0000 mg | ORAL_TABLET | Freq: Every day | ORAL | 1 refills | Status: DC | PRN

## 2022-08-25 MED ORDER — AMLODIPINE BESYLATE 5 MG PO TABS
ORAL_TABLET | ORAL | 1 refills | Status: DC
Start: 2022-08-25 — End: 2023-03-09

## 2022-08-25 NOTE — Assessment & Plan Note (Signed)
Stable, continue same regimen.  Commended on decreased alcohol use.  Check labs, adjustment and plan accordingly.

## 2022-08-25 NOTE — Assessment & Plan Note (Signed)
Repeat lipid panel, statin recommended given ASCVD risk score.Marland Kitchen  He would prefer not to take medication.  Did agree to coronary calcium scoring testing depending on labs to help decide on meds.  Will order if still elevated, and elevated ASCVD risk score.  Plans on weight loss, has cut back on alcohol, staying active.

## 2022-08-25 NOTE — Progress Notes (Signed)
Subjective:  Patient ID: Brian Mcguire, male    DOB: 1950-10-19  Age: 72 y.o. MRN: [id number]  CC:  Chief Complaint  Patient presents with   Hypertension    Pt states he wants to discuss if ok to take tums daily. Pt is fasting     HPI Brian Mcguire presents for   Hypertension: Amlodipine 5 mg daily, lisinopril HCTZ 20/25 mg daily.  Cut back on alcohol previously. Decided to avoid alcohol moving forward. Up to a few drinks per day, not daily.  Home readings: none, no new med side effects.  BP Readings from Last 3 Encounters:  08/25/22 122/78  01/15/22 (!) 140/70  07/11/21 134/76   Lab Results  Component Value Date   CREATININE 0.95 01/15/2022    Erectile dysfunction Treated with sildenafil 20 mg.  Intermittent dosing effective without headache, flushing, vision/hearing changes or chest pain with exertion. Still effective with one pill, rare use.   Hyperlipidemia: Mild elevation previously, diet/exercise approach planned, no current meds.  Plan goal weight of 210, and when discussed in September had planned on improved diet and activity changes.  Unfortunately weight has increased since that time from 231-241. Plans to decrease alcohol and lose weight and wants to check labs after weight loss  Walking some at work - up to 5-6 miles, some other walking.  Would consider CCS testing.  Wt Readings from Last 3 Encounters:  08/25/22 241 lb (109.3 kg)  01/15/22 231 lb (104.8 kg)  07/11/21 221 lb 12.8 oz (100.6 kg)   The 10-year ASCVD risk score (Arnett DK, et al., 2019) is: 20.6%   Values used to calculate the score:     Age: 89 years     Sex: Male     Is Non-Hispanic African American: No     Diabetic: No     Tobacco smoker: No     Systolic Blood Pressure: 122 mmHg     Is BP treated: Yes     HDL Cholesterol: 52.5 mg/dL     Total Cholesterol: 178 mg/dL  Lab Results  Component Value Date   CHOL 178 01/15/2022   HDL 52.50 01/15/2022   LDLCALC 113 (H) 01/15/2022    TRIG 65.0 01/15/2022   CHOLHDL 3 01/15/2022   Lab Results  Component Value Date   ALT 16 01/15/2022   AST 19 01/15/2022   ALKPHOS 48 01/15/2022   BILITOT 1.5 (H) 01/15/2022   Heartburn: Notes with certain foods, 3-4 times per week with late meals or spicy food. Tx: pepcid or tums.   History Patient Active Problem List   Diagnosis Date Noted   Essential hypertension 08/25/2022   Hyperlipidemia 08/25/2022   Erectile dysfunction 08/25/2022   Gastroesophageal reflux disease 08/25/2022   Substance dependency 12/03/2015    History reviewed. No pertinent past medical history.    Review of Systems  Constitutional:  Negative for fatigue and unexpected weight change.  Eyes:  Negative for visual disturbance.  Respiratory:  Negative for cough, chest tightness and shortness of breath.   Cardiovascular:  Negative for chest pain, palpitations and leg swelling.  Gastrointestinal:  Negative for abdominal pain and blood in stool.  Neurological:  Negative for dizziness, light-headedness and headaches.     Objective:   Vitals:   08/25/22 1035  BP: 122/78  Pulse: 73  Temp: 97.8 F (36.6 C)  TempSrc: Temporal  SpO2: 97%  Weight: 241 lb (109.3 kg)  Height: 5\' 11"  (1.803 m)     Physical Exam  Vitals reviewed.  Constitutional:      Appearance: He is well-developed.  HENT:     Head: Normocephalic and atraumatic.  Neck:     Vascular: No carotid bruit or JVD.  Cardiovascular:     Rate and Rhythm: Normal rate and regular rhythm.     Heart sounds: Normal heart sounds. No murmur heard. Pulmonary:     Effort: Pulmonary effort is normal.     Breath sounds: Normal breath sounds. No rales.  Musculoskeletal:     Right lower leg: No edema.     Left lower leg: No edema.  Skin:    General: Skin is warm and dry.  Neurological:     Mental Status: He is alert and oriented to person, place, and time.  Psychiatric:        Mood and Affect: Mood normal.        Assessment & Plan:   Brian Mcguire is a 72 y.o. male . Essential hypertension Assessment & Plan: Stable, continue same regimen.  Commended on decreased alcohol use.  Check labs, adjustment and plan accordingly.  Orders: -     Comprehensive metabolic panel -     amLODIPine Besylate; TAKE 1 TABLET(5 MG) BY MOUTH DAILY  Dispense: 90 tablet; Refill: 1 -     Lisinopril-hydroCHLOROthiazide; Take 1 tablet by mouth daily.  Dispense: 90 tablet; Refill: 1  Hyperlipidemia, unspecified hyperlipidemia type Assessment & Plan: Repeat lipid panel, statin recommended given ASCVD risk score.Marland Kitchen  He would prefer not to take medication.  Did agree to coronary calcium scoring testing depending on labs to help decide on meds.  Will order if still elevated, and elevated ASCVD risk score.  Plans on weight loss, has cut back on alcohol, staying active.  Orders: -     Lipid panel  Erectile dysfunction, unspecified erectile dysfunction type Assessment & Plan: Stable with low-dose sildenafil, intermittently without new side effects.  Continue same.  Potential risks have been discussed.  Orders: -     Sildenafil Citrate; Take 1 tablet (20 mg total) by mouth daily as needed.  Dispense: 45 tablet; Refill: 1  Gastroesophageal reflux disease, unspecified whether esophagitis present Assessment & Plan: Trigger avoidance, handout given on foods and management.  Over-the-counter Pepcid as needed short-term with RTC precautions if persistent need or worsening symptoms.     Patient Instructions  See info below on heartburn. Prevention is key. Pepcid up to 2 times per day is an option short term if needed (few weeks) Follow up if that is not effective or worsening symptoms.  Blood pressure looks okay, no changes for now.  Cutting back on alcohol should help with blood pressure as well as weight management.  I will check the cholesterol levels, but if still elevated I would recommend the coronary calcium test to help determine if need for  statin medication.  I will let you know once I review results.  Thanks for coming in today and please let me know if there are questions.   Food Choices for Gastroesophageal Reflux Disease, Adult When you have gastroesophageal reflux disease (GERD), the foods you eat and your eating habits are very important. Choosing the right foods can help ease the discomfort of GERD. Consider working with a dietitian to help you make healthy food choices. What are tips for following this plan? Reading food labels Look for foods that are low in saturated fat. Foods that have less than 5% of daily value (DV) of fat and 0 g of trans  fats may help with your symptoms. Cooking Cook foods using methods other than frying. This may include baking, steaming, grilling, or broiling. These are all methods that do not need a lot of fat for cooking. To add flavor, try to use herbs that are low in spice and acidity. Meal planning  Choose healthy foods that are low in fat, such as fruits, vegetables, whole grains, low-fat dairy products, lean meats, fish, and poultry. Eat frequent, small meals instead of three large meals each day. Eat your meals slowly, in a relaxed setting. Avoid bending over or lying down until 2-3 hours after eating. Limit high-fat foods such as fatty meats or fried foods. Limit your intake of fatty foods, such as oils, butter, and shortening. Avoid the following as told by your health care provider: Foods that cause symptoms. These may be different for different people. Keep a food diary to keep track of foods that cause symptoms. Alcohol. Drinking large amounts of liquid with meals. Eating meals during the 2-3 hours before bed. Lifestyle Maintain a healthy weight. Ask your health care provider what weight is healthy for you. If you need to lose weight, work with your health care provider to do so safely. Exercise for at least 30 minutes on 5 or more days each week, or as told by your health care  provider. Avoid wearing clothes that fit tightly around your waist and chest. Do not use any products that contain nicotine or tobacco. These products include cigarettes, chewing tobacco, and vaping devices, such as e-cigarettes. If you need help quitting, ask your health care provider. Sleep with the head of your bed raised. Use a wedge under the mattress or blocks under the bed frame to raise the head of the bed. Chew sugar-free gum after mealtimes. What foods should I eat?  Eat a healthy, well-balanced diet of fruits, vegetables, whole grains, low-fat dairy products, lean meats, fish, and poultry. Each person is different. Foods that may trigger symptoms in one person may not trigger any symptoms in another person. Work with your health care provider to identify foods that are safe for you. The items listed above may not be a complete list of recommended foods and beverages. Contact a dietitian for more information. What foods should I avoid? Limiting some of these foods may help manage the symptoms of GERD. Everyone is different. Consult a dietitian or your health care provider to help you identify the exact foods to avoid, if any. Fruits Any fruits prepared with added fat. Any fruits that cause symptoms. For some people this may include citrus fruits, such as oranges, grapefruit, pineapple, and lemons. Vegetables Deep-fried vegetables. [place] fries. Any vegetables prepared with added fat. Any vegetables that cause symptoms. For some people, this may include tomatoes and tomato products, chili peppers, onions and garlic, and horseradish. Grains Pastries or quick breads with added fat. Meats and other proteins High-fat meats, such as fatty beef or pork, hot dogs, ribs, ham, sausage, salami, and bacon. Fried meat or protein, including fried fish and fried chicken. Nuts and nut butters, in large amounts. Dairy Whole milk and chocolate milk. Sour cream. Cream. Ice cream. Cream cheese.  Milkshakes. Fats and oils Butter. Margarine. Shortening. Ghee. Beverages Coffee and tea, with or without caffeine. Carbonated beverages. Sodas. Energy drinks. Fruit juice made with acidic fruits, such as orange or grapefruit. Tomato juice. Alcoholic drinks. Sweets and desserts Chocolate and cocoa. Donuts. Seasonings and condiments Pepper. Peppermint and spearmint. Added salt. Any condiments, herbs, or seasonings that  cause symptoms. For some people, this may include curry, hot sauce, or vinegar-based salad dressings. The items listed above may not be a complete list of foods and beverages to avoid. Contact a dietitian for more information. Questions to ask your health care provider Diet and lifestyle changes are usually the first steps that are taken to manage symptoms of GERD. If diet and lifestyle changes do not improve your symptoms, talk with your health care provider about taking medicines. Where to find more information International Foundation for Gastrointestinal Disorders: aboutgerd.org Summary When you have gastroesophageal reflux disease (GERD), food and lifestyle choices may be very helpful in easing the discomfort of GERD. Eat frequent, small meals instead of three large meals each day. Eat your meals slowly, in a relaxed setting. Avoid bending over or lying down until 2-3 hours after eating. Limit high-fat foods such as fatty meats or fried foods. This information is not intended to replace advice given to you by your health care provider. Make sure you discuss any questions you have with your health care provider. Document Revised: 10/31/2019 Document Reviewed: 10/31/2019 Elsevier Patient Education  2023 Elsevier Inc.          Signed,   Meredith Staggers, MD [facility], [facility] 08/25/22 11:26 AM

## 2022-08-25 NOTE — Patient Instructions (Addendum)
See info below on heartburn. Prevention is key. Pepcid up to 2 times per day is an option short term if needed (few weeks) Follow up if that is not effective or worsening symptoms.  Blood pressure looks okay, no changes for now.  Cutting back on alcohol should help with blood pressure as well as weight management.  I will check the cholesterol levels, but if still elevated I would recommend the coronary calcium test to help determine if need for statin medication.  I will let you know once I review results.  Thanks for coming in today and please let me know if there are questions.   Food Choices for Gastroesophageal Reflux Disease, Adult When you have gastroesophageal reflux disease (GERD), the foods you eat and your eating habits are very important. Choosing the right foods can help ease the discomfort of GERD. Consider working with a dietitian to help you make healthy food choices. What are tips for following this plan? Reading food labels Look for foods that are low in saturated fat. Foods that have less than 5% of daily value (DV) of fat and 0 g of trans fats may help with your symptoms. Cooking Cook foods using methods other than frying. This may include baking, steaming, grilling, or broiling. These are all methods that do not need a lot of fat for cooking. To add flavor, try to use herbs that are low in spice and acidity. Meal planning  Choose healthy foods that are low in fat, such as fruits, vegetables, whole grains, low-fat dairy products, lean meats, fish, and poultry. Eat frequent, small meals instead of three large meals each day. Eat your meals slowly, in a relaxed setting. Avoid bending over or lying down until 2-3 hours after eating. Limit high-fat foods such as fatty meats or fried foods. Limit your intake of fatty foods, such as oils, butter, and shortening. Avoid the following as told by your health care provider: Foods that cause symptoms. These may be different for  different people. Keep a food diary to keep track of foods that cause symptoms. Alcohol. Drinking large amounts of liquid with meals. Eating meals during the 2-3 hours before bed. Lifestyle Maintain a healthy weight. Ask your health care provider what weight is healthy for you. If you need to lose weight, work with your health care provider to do so safely. Exercise for at least 30 minutes on 5 or more days each week, or as told by your health care provider. Avoid wearing clothes that fit tightly around your waist and chest. Do not use any products that contain nicotine or tobacco. These products include cigarettes, chewing tobacco, and vaping devices, such as e-cigarettes. If you need help quitting, ask your health care provider. Sleep with the head of your bed raised. Use a wedge under the mattress or blocks under the bed frame to raise the head of the bed. Chew sugar-free gum after mealtimes. What foods should I eat?  Eat a healthy, well-balanced diet of fruits, vegetables, whole grains, low-fat dairy products, lean meats, fish, and poultry. Each person is different. Foods that may trigger symptoms in one person may not trigger any symptoms in another person. Work with your health care provider to identify foods that are safe for you. The items listed above may not be a complete list of recommended foods and beverages. Contact a dietitian for more information. What foods should I avoid? Limiting some of these foods may help manage the symptoms of GERD. Everyone is different. Consult  a dietitian or your health care provider to help you identify the exact foods to avoid, if any. Fruits Any fruits prepared with added fat. Any fruits that cause symptoms. For some people this may include citrus fruits, such as oranges, grapefruit, pineapple, and lemons. Vegetables Deep-fried vegetables. [place] fries. Any vegetables prepared with added fat. Any vegetables that cause symptoms. For some people, this  may include tomatoes and tomato products, chili peppers, onions and garlic, and horseradish. Grains Pastries or quick breads with added fat. Meats and other proteins High-fat meats, such as fatty beef or pork, hot dogs, ribs, ham, sausage, salami, and bacon. Fried meat or protein, including fried fish and fried chicken. Nuts and nut butters, in large amounts. Dairy Whole milk and chocolate milk. Sour cream. Cream. Ice cream. Cream cheese. Milkshakes. Fats and oils Butter. Margarine. Shortening. Ghee. Beverages Coffee and tea, with or without caffeine. Carbonated beverages. Sodas. Energy drinks. Fruit juice made with acidic fruits, such as orange or grapefruit. Tomato juice. Alcoholic drinks. Sweets and desserts Chocolate and cocoa. Donuts. Seasonings and condiments Pepper. Peppermint and spearmint. Added salt. Any condiments, herbs, or seasonings that cause symptoms. For some people, this may include curry, hot sauce, or vinegar-based salad dressings. The items listed above may not be a complete list of foods and beverages to avoid. Contact a dietitian for more information. Questions to ask your health care provider Diet and lifestyle changes are usually the first steps that are taken to manage symptoms of GERD. If diet and lifestyle changes do not improve your symptoms, talk with your health care provider about taking medicines. Where to find more information International Foundation for Gastrointestinal Disorders: aboutgerd.org Summary When you have gastroesophageal reflux disease (GERD), food and lifestyle choices may be very helpful in easing the discomfort of GERD. Eat frequent, small meals instead of three large meals each day. Eat your meals slowly, in a relaxed setting. Avoid bending over or lying down until 2-3 hours after eating. Limit high-fat foods such as fatty meats or fried foods. This information is not intended to replace advice given to you by your health care provider. Make  sure you discuss any questions you have with your health care provider. Document Revised: 10/31/2019 Document Reviewed: 10/31/2019 Elsevier Patient Education  2023 [employer].

## 2022-08-25 NOTE — Assessment & Plan Note (Signed)
Stable with low-dose sildenafil, intermittently without new side effects.  Continue same.  Potential risks have been discussed.

## 2022-08-25 NOTE — Assessment & Plan Note (Signed)
Trigger avoidance, handout given on foods and management.  Over-the-counter Pepcid as needed short-term with RTC precautions if persistent need or worsening symptoms.

## 2022-08-27 ENCOUNTER — Telehealth: Payer: Self-pay

## 2022-08-27 NOTE — Telephone Encounter (Signed)
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

## 2022-08-27 NOTE — Telephone Encounter (Signed)
Forms picked up from bin placed in Dr. Greene's folder.  

## 2022-08-27 NOTE — Telephone Encounter (Signed)
Schedule appt for preop eval including EKG. I will hold onto paperwork for that visit. Looks like earlier message indicates he will be calling back to schedule appt.

## 2022-08-27 NOTE — Telephone Encounter (Signed)
Called to schedule appt for EKG, he asked to call back after looking at schedule

## 2022-09-24 ENCOUNTER — Telehealth: Payer: Self-pay

## 2022-09-24 NOTE — Telephone Encounter (Signed)
Called patient to schedule an appointment we were not aware of surgery clearance need at 08/25/22 appointment so we do require another visit to complete this form, LM for pt to call back and schedule

## 2022-09-25 NOTE — Telephone Encounter (Signed)
LM again to call back and schedule

## 2022-09-26 NOTE — Telephone Encounter (Signed)
Pt has been scheduled.  °

## 2022-10-02 ENCOUNTER — Ambulatory Visit (INDEPENDENT_AMBULATORY_CARE_PROVIDER_SITE_OTHER): Payer: PPO | Admitting: Family Medicine

## 2022-10-02 ENCOUNTER — Encounter: Payer: Self-pay | Admitting: Family Medicine

## 2022-10-02 VITALS — BP 132/80 | HR 65 | Temp 98.4°F | Ht 71.0 in | Wt 245.2 lb

## 2022-10-02 DIAGNOSIS — Z01818 Encounter for other preprocedural examination: Secondary | ICD-10-CM | POA: Diagnosis not present

## 2022-10-02 DIAGNOSIS — R9431 Abnormal electrocardiogram [ECG] [EKG]: Secondary | ICD-10-CM | POA: Diagnosis not present

## 2022-10-02 NOTE — Patient Instructions (Signed)
Thanks for coming in today.  I will work on my portion of the paperwork for your surgeon for medical clearance for the surgery, but with your current EKG and previous stress test I am going to refer you to cardiology for them to complete the cardiac portion.  They can decide if further testing needed but you do not have any current symptoms of concern at this time.  Take care.

## 2022-10-02 NOTE — Progress Notes (Signed)
Subjective:  Patient ID: Brian Mcguire, male    DOB: 05/28/1950  Age: 72 y.o. MRN: [id number]  CC:  Chief Complaint  Patient presents with   Medical Clearance    Lt knee replacement pt does not have a cardiologist needs EKG     HPI Brian Mcguire presents for   Preoperative evaluation.  Request received from [facility] orthopedics - Dr. Blanchie Dessert, for preoperative risk assessment, left total knee replacement at [facility] with spinal anesthesia or choice anesthesia. Intermediate risk surgery.  Date of surgery pending.  Per their paperwork they are ordering a CBC, BMP, hemoglobin A1c if diabetic and PT/INR was planned on date of surgery.  EKG, chest x-ray if needed.  UA not indicated unless UTI symptoms.  Normal A1C in 01/2022, not diabetic.   Last visit with me in April.  Hypertension stable at that time on amlodipine, lisinopril HCTZ.  Alcohol use had decreased.  Continued on sildenafil for erectile dysfunction as asymptomatic.  Hyperlipidemia but deferred medications, planned diet/exercise approach.  No known hx of CAD, MI, arrhythmia. No hx of TIA/CVA.  No hx of CKD - eFGR 66.57 on 08/25/22 labs.  RCRI score of 0 Highest level of activity - walking able to walk 1-2 miles without Chest pain or shortness of breath.  No CPAP or known hx of OSA.  No aspirin use.   Has seen cardiology in past - Dr. Jacinto Halim, Dr. Rosemary Holms - 2019. No recent visit. Myocardial perfusion imaging in 2019. Stress EKG negative for ischemia, rate related sinus tachycardia rare PVCs with no ST-T wave changes of ischemia.  Brief 3-4 beat episodes of atrial tachycardia during stress testing.  Hypertensive blood pressure response perfusion imaging study quality was good, left ventricle mildly dilated, EF calculated 45% but visually appeared to be normal intermediate risk study.  Performed by Dr. Jacinto Halim.    History Patient Active Problem List   Diagnosis Date Noted   Essential hypertension 08/25/2022    Hyperlipidemia 08/25/2022   Erectile dysfunction 08/25/2022   Gastroesophageal reflux disease 08/25/2022   Substance dependency (HCC) 12/03/2015   No past medical history on file. Past Surgical History:  Procedure Laterality Date   CHOLECYSTECTOMY     COLON SURGERY     ROTATOR CUFF REPAIR Left 06/17/2021   No Known Allergies Prior to Admission medications   Medication Sig Start Date End Date Taking? Authorizing Provider  amLODipine (NORVASC) 5 MG tablet TAKE 1 TABLET(5 MG) BY MOUTH DAILY 08/25/22   Shade Flood, MD  lisinopril-hydrochlorothiazide (ZESTORETIC) 20-25 MG tablet Take 1 tablet by mouth daily. 08/25/22   Shade Flood, MD  sildenafil (REVATIO) 20 MG tablet Take 1 tablet (20 mg total) by mouth daily as needed. 08/25/22   Shade Flood, MD   Social History   Socioeconomic History   Marital status: Divorced    Spouse name: Not on file   Number of children: 2   Years of education: Not on file   Highest education level: Some college, no degree  Occupational History   Not on file  Tobacco Use   Smoking status: Never   Smokeless tobacco: Never  Vaping Use   Vaping Use: Never used  Substance and Sexual Activity   Alcohol use: Yes    Alcohol/week: 3.0 standard drinks of alcohol    Types: 1 Glasses of wine, 1 Cans of beer, 1 Shots of liquor per week    Comment: pt notes 3-4 drinks a week   Drug use:  No   Sexual activity: Never  Other Topics Concern   Not on file  Social History Narrative   Not on file   Social Determinants of Health   Financial Resource Strain: Low Risk  (08/13/2022)   Overall Financial Resource Strain (CARDIA)    Difficulty of Paying Living Expenses: Not hard at all  Food Insecurity: No Food Insecurity (08/13/2022)   Hunger Vital Sign    Worried About Running Out of Food in the Last Year: Never true    Ran Out of Food in the Last Year: Never true  Transportation Needs: No Transportation Needs (08/13/2022)   PRAPARE - Therapist, art (Medical): No    Lack of Transportation (Non-Medical): No  Physical Activity: Insufficiently Active (08/13/2022)   Exercise Vital Sign    Days of Exercise per Week: 3 days    Minutes of Exercise per Session: 30 min  Stress: No Stress Concern Present (08/13/2022)   [employer] of Occupational Health - Occupational Stress Questionnaire    Feeling of Stress : Not at all  Social Connections: Moderately Integrated (08/13/2022)   Social Connection and Isolation Panel [NHANES]    Frequency of Communication with Friends and Family: More than three times a week    Frequency of Social Gatherings with Friends and Family: More than three times a week    Attends Religious Services: Never    Database administrator or Organizations: Yes    Attends Engineer, structural: More than 4 times per year    Marital Status: Living with partner  Intimate Partner Violence: Not At Risk (08/13/2022)   Humiliation, Afraid, Rape, and Kick questionnaire    Fear of Current or Ex-Partner: No    Emotionally Abused: No    Physically Abused: No    Sexually Abused: No    Review of Systems  Per hpi  Objective:   Vitals:   10/02/22 1302  BP: 132/80  Pulse: 65  Temp: 98.4 F (36.9 C)  TempSrc: Temporal  SpO2: 95%  Weight: 245 lb 3.2 oz (111.2 kg)  Height: 5\' 11"  (1.803 m)     Physical Exam Vitals reviewed.  Constitutional:      Appearance: He is well-developed.  HENT:     Head: Normocephalic and atraumatic.  Neck:     Vascular: No carotid bruit or JVD.  Cardiovascular:     Rate and Rhythm: Normal rate and regular rhythm.     Heart sounds: Normal heart sounds. No murmur heard. Pulmonary:     Effort: Pulmonary effort is normal.     Breath sounds: Normal breath sounds. No rales.  Musculoskeletal:     Right lower leg: No edema.     Left lower leg: No edema.  Skin:    General: Skin is warm and dry.  Neurological:     Mental Status: He is alert and oriented to  person, place, and time.  Psychiatric:        Mood and Affect: Mood normal.      EKG: Sinus rhythm, PR 224 with first-degree AV block.  Premature atrial complex noted.  Rightward axis. Compared to 03/27/2017, appears similar without specific ST or T wave changes, poor R wave progression noted at that time.  Assessment & Plan:  Brian Mcguire is a 72 y.o. male . Preoperative evaluation to rule out surgical contraindication - Plan: EKG 12-Lead, Ambulatory referral to Cardiology  Abnormal EKG - Plan: Ambulatory referral to Cardiology No apparent contraindications  from a medical standpoint and appears to be acceptable medical risk for planned procedure.  Lab work recently overall reassuring.  Plan for CBC, BMP at time of surgery.  RCRI score 0, but abnormal EKG with intermediate risk stress testing in 2019, no recent cardiac evaluation.  Will refer back to cardiology to evaluate most recent EKG, determine if other testing needed and for cardiac clearance for planned procedure.  I will complete my portion of paperwork.  No orders of the defined types were placed in this encounter.  Patient Instructions  Thanks for coming in today.  I will work on my portion of the paperwork for your surgeon for medical clearance for the surgery, but with your current EKG and previous stress test I am going to refer you to cardiology for them to complete the cardiac portion.  They can decide if further testing needed but you do not have any current symptoms of concern at this time.  Take care.     Signed,   Meredith Staggers, MD [facility], [facility] 10/02/22 1:58 PM

## 2022-10-13 ENCOUNTER — Ambulatory Visit: Payer: PPO | Admitting: Cardiology

## 2022-10-13 ENCOUNTER — Encounter: Payer: Self-pay | Admitting: Cardiology

## 2022-10-13 VITALS — BP 154/91 | HR 70 | Ht 71.0 in | Wt 245.0 lb

## 2022-10-13 DIAGNOSIS — I10 Essential (primary) hypertension: Secondary | ICD-10-CM

## 2022-10-13 DIAGNOSIS — Z01810 Encounter for preprocedural cardiovascular examination: Secondary | ICD-10-CM | POA: Insufficient documentation

## 2022-10-13 NOTE — Progress Notes (Signed)
Patient referred by Shade Flood, MD for preop evaluation  Subjective:   Brian Mcguire, male    DOB: Aug 21, 1950, 72 y.o.   MRN: [id number]   Chief Complaint  Patient presents with   Abnormal ECG   Pre-op Exam   New Patient (Initial Visit)     HPI  72 y.o. Caucasian male with hypertension, here for preop evaluation  Patient is hoping to undergo left knee replacement in the hear future. His knee pain is mostly while walking upstairs. He is able to walks 3-4 miles on flat surface without any complaints of chest pain, dyspnea.   Patient was last seen by in 2019. Stress test then was inconclusive due to IVCD with tachycardia, without ay symptoms. EF was normal. PASP was 43 mmHg.    Past Medical History:  Diagnosis Date   Hypertension      Past Surgical History:  Procedure Laterality Date   CHOLECYSTECTOMY     COLON SURGERY     ROTATOR CUFF REPAIR Left 06/17/2021     Social History   Tobacco Use  Smoking Status Never  Smokeless Tobacco Never    Social History   Substance and Sexual Activity  Alcohol Use Yes   Alcohol/week: 3.0 standard drinks of alcohol   Types: 1 Glasses of wine, 1 Cans of beer, 1 Shots of liquor per week   Comment: pt notes 3-4 drinks a week     Family History  Problem Relation Age of Onset   Cancer Father    Hypertension Father    Heart disease Father    Heart disease Maternal Grandfather       Current Outpatient Medications:    amLODipine (NORVASC) 5 MG tablet, TAKE 1 TABLET(5 MG) BY MOUTH DAILY, Disp: 90 tablet, Rfl: 1   lisinopril-hydrochlorothiazide (ZESTORETIC) 20-25 MG tablet, Take 1 tablet by mouth daily., Disp: 90 tablet, Rfl: 1   sildenafil (REVATIO) 20 MG tablet, Take 1 tablet (20 mg total) by mouth daily as needed., Disp: 45 tablet, Rfl: 1   ketorolac (ACULAR) 0.5 % ophthalmic solution, Place 1 drop into both eyes 4 (four) times daily. (Patient not taking: Reported on 10/13/2022), Disp: , Rfl:    ofloxacin  (OCUFLOX) 0.3 % ophthalmic solution, Place 1 drop into both eyes. (Patient not taking: Reported on 10/13/2022), Disp: , Rfl:    prednisoLONE acetate (PRED FORTE) 1 % ophthalmic suspension, Place 1 drop into both eyes every 2 (two) hours while awake. (Patient not taking: Reported on 10/13/2022), Disp: , Rfl:    Cardiovascular and other pertinent studies:  Reviewed external labs and tests, independently interpreted  EKG 10/13/2022: Sinus rhythm 69 bpm First degree A-V block Occasional PAC   Poor R-wave progression  Cannot exclude old anteroseptal infarct Low voltage    Recent labs: 08/25/2022: Glucose 94, BUN/Cr 16/1.11. EGFR 66. Na/K 136/4.4. Rest of the CMP normal CBC NA Chol 182, TG 85, HDL 35, LDL 130  01/2022: HbA1C 5.3%   Review of Systems  Cardiovascular:  Negative for chest pain, dyspnea on exertion, leg swelling, palpitations and syncope.  Musculoskeletal:  Positive for joint pain.         Vitals:   10/13/22 0849 10/13/22 0856  BP: (!) 144/100 (!) 154/91  Pulse: 68 70  SpO2: 98% 97%     Body mass index is 34.17 kg/m. Filed Weights   10/13/22 0849  Weight: 245 lb (111.1 kg)     Objective:   Physical Exam Vitals and nursing note reviewed.  Constitutional:      General: He is not in acute distress. Neck:     Vascular: No JVD.  Cardiovascular:     Rate and Rhythm: Normal rate and regular rhythm.     Heart sounds: Normal heart sounds. No murmur heard. Pulmonary:     Effort: Pulmonary effort is normal.     Breath sounds: Normal breath sounds. No wheezing or rales.  Musculoskeletal:     Right lower leg: No edema.     Left lower leg: No edema.          Visit diagnoses:   ICD-10-CM   1. Preop cardiovascular exam  Z01.810 EKG 12-Lead    2. Essential hypertension  I10 PCV ECHOCARDIOGRAM COMPLETE       Orders Placed This Encounter  Procedures   EKG 12-Lead      Assessment & Recommendations:    72 y.o. Caucasian male with hypertension,  here for preop evaluation  Pre-op evaluation: No angina, dyspnea symptoms at baseline.  Nonspecific EKG abnormalities at baseline, with occasional PVC. Will check echocardiogram. If no major abnormalities, could proceed with knee surgery with low cardiac risk.  Hypertension: BP elevated today, generally well controled-as recently as last month at PCP visit. No changes made to baseline antihypertensive therapy.   F/u as needed   Thank you for referring the patient to [place]. Please feel free to contact with any questions.   Elder Negus, MD Pager: [phone] Office: [phone]

## 2022-10-16 DIAGNOSIS — H25813 Combined forms of age-related cataract, bilateral: Secondary | ICD-10-CM | POA: Diagnosis not present

## 2022-10-20 ENCOUNTER — Ambulatory Visit: Payer: PPO

## 2022-10-20 DIAGNOSIS — D1801 Hemangioma of skin and subcutaneous tissue: Secondary | ICD-10-CM | POA: Diagnosis not present

## 2022-10-20 DIAGNOSIS — L820 Inflamed seborrheic keratosis: Secondary | ICD-10-CM | POA: Diagnosis not present

## 2022-10-20 DIAGNOSIS — D229 Melanocytic nevi, unspecified: Secondary | ICD-10-CM | POA: Diagnosis not present

## 2022-10-20 DIAGNOSIS — C44619 Basal cell carcinoma of skin of left upper limb, including shoulder: Secondary | ICD-10-CM | POA: Diagnosis not present

## 2022-10-20 DIAGNOSIS — L570 Actinic keratosis: Secondary | ICD-10-CM | POA: Diagnosis not present

## 2022-10-20 DIAGNOSIS — L821 Other seborrheic keratosis: Secondary | ICD-10-CM | POA: Diagnosis not present

## 2022-10-20 DIAGNOSIS — D485 Neoplasm of uncertain behavior of skin: Secondary | ICD-10-CM | POA: Diagnosis not present

## 2022-10-20 DIAGNOSIS — L814 Other melanin hyperpigmentation: Secondary | ICD-10-CM | POA: Diagnosis not present

## 2022-10-20 DIAGNOSIS — L578 Other skin changes due to chronic exposure to nonionizing radiation: Secondary | ICD-10-CM | POA: Diagnosis not present

## 2022-10-20 DIAGNOSIS — A630 Anogenital (venereal) warts: Secondary | ICD-10-CM | POA: Diagnosis not present

## 2022-11-03 DIAGNOSIS — C44619 Basal cell carcinoma of skin of left upper limb, including shoulder: Secondary | ICD-10-CM | POA: Diagnosis not present

## 2022-11-09 IMAGING — CT CT MAXILLOFACIAL W/O CM
3 series · 15 of 47 positions shown, 18 images · non-contrast
Comparison: None.

CLINICAL DATA: Tripped over post and hit head

EXAM:
CT CERVICAL SPINE WITHOUT CONTRAST
TECHNIQUE: Multidetector CT imaging of the cervical spine was performed without
intravenous contrast. Multiplanar CT image reconstructions were also
generated.

[Series 3: max bone · axial · 0.35mm/px · z∈[-104,+36]mm · 9 of 82 slices shown, 12 images]
[im 6/82  brain]
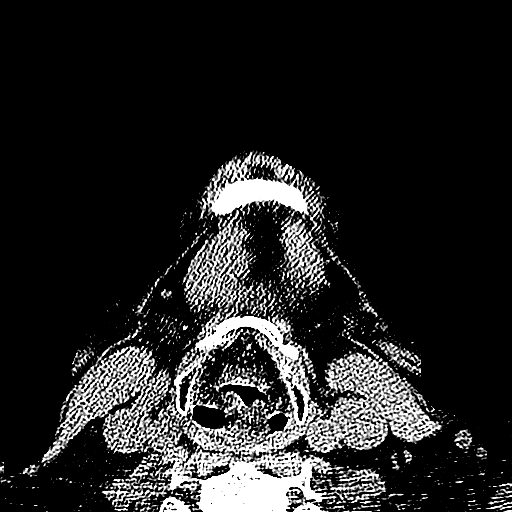
[im 6/82  bone]
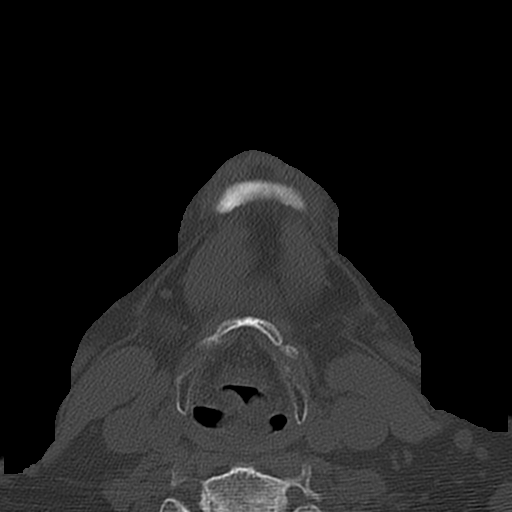
[im 14/82  bone]
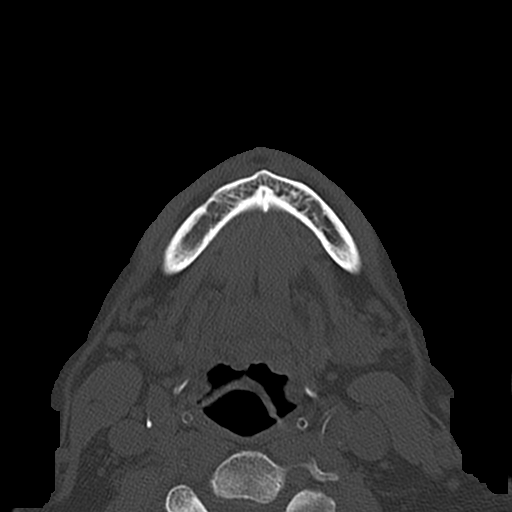
[im 23/82  bone]
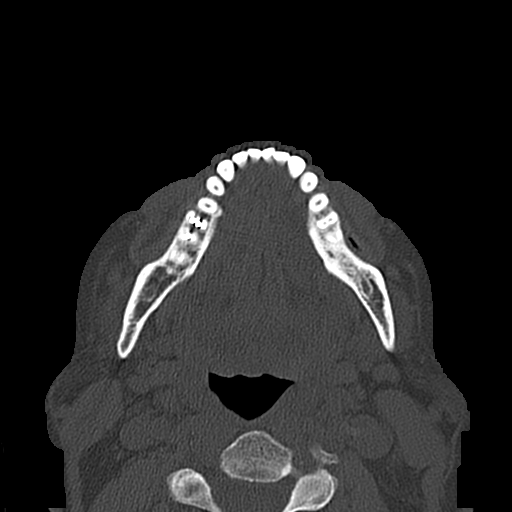
[im 31/82  bone]
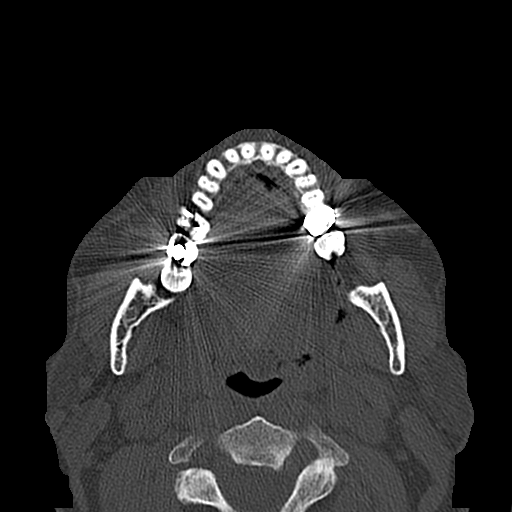
[im 42/82  brain]
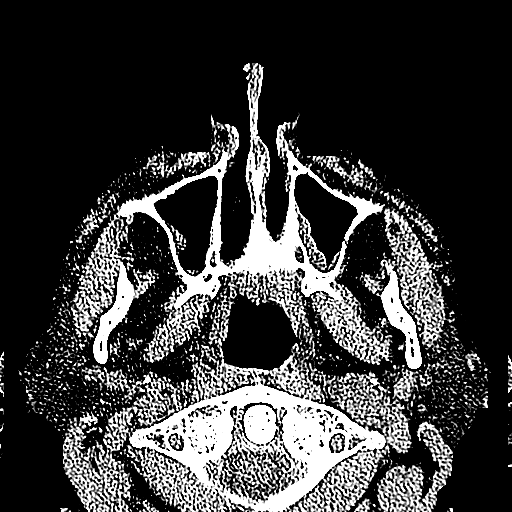
[im 42/82  bone]
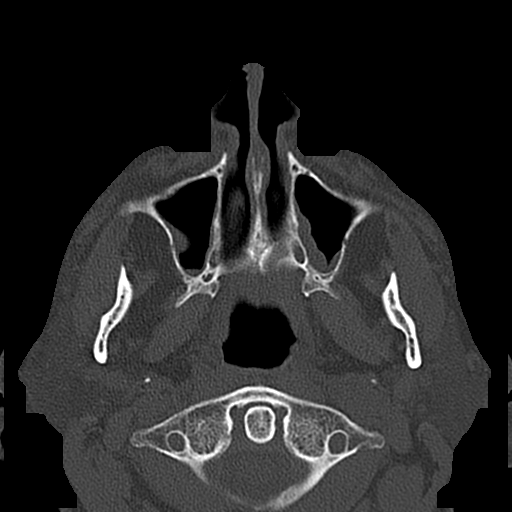
[im 51/82  bone]
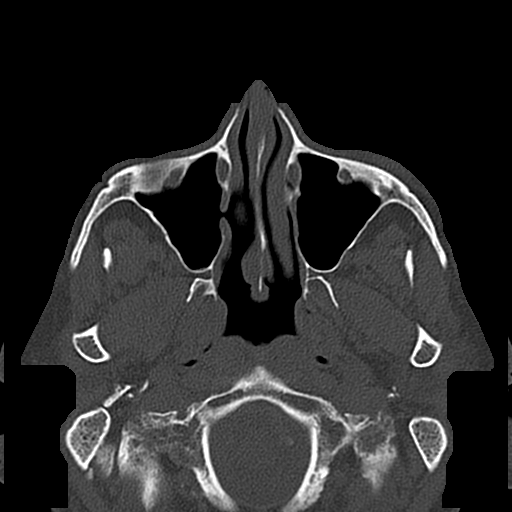
[im 59/82  bone]
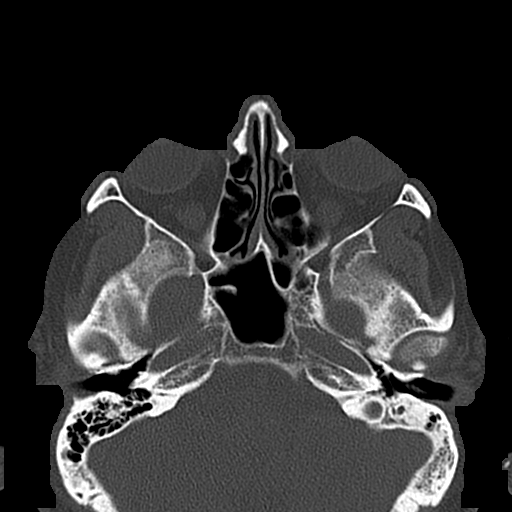
[im 68/82  bone]
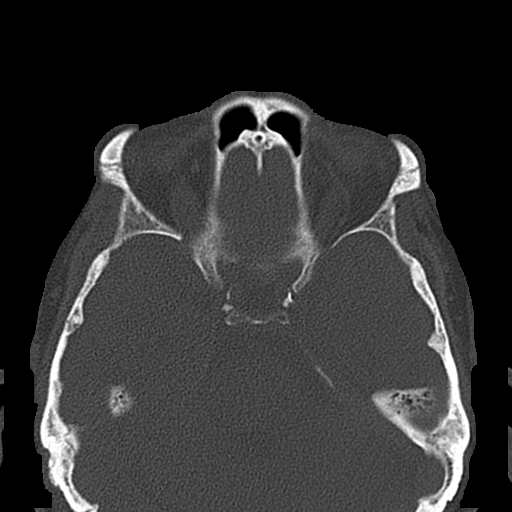
[im 76/82  brain]
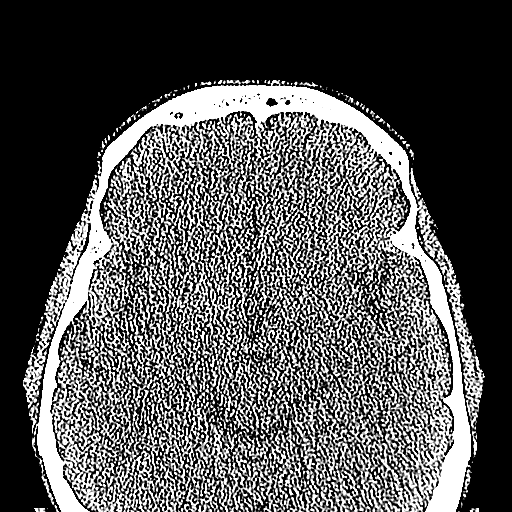
[im 76/82  bone]
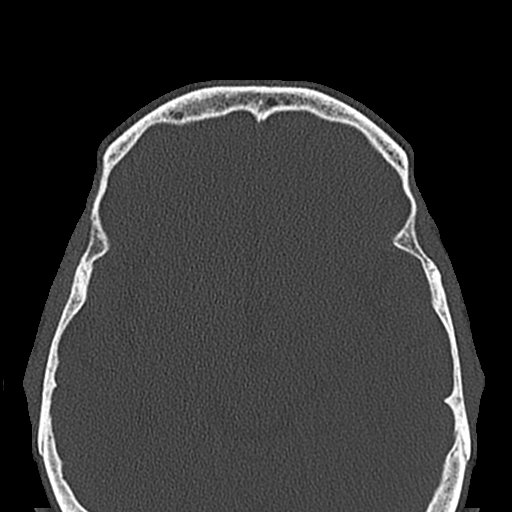

[Series 6: coronal soft · coronal · 0.38mm/px · 3 of 73 slices shown]
[im 25/73  bone]
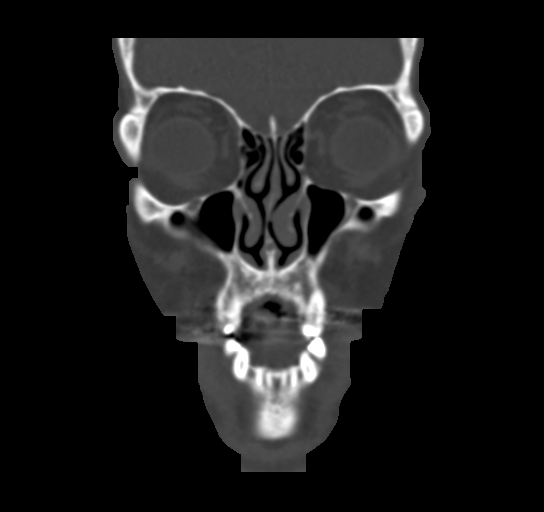
[im 33/73  bone]
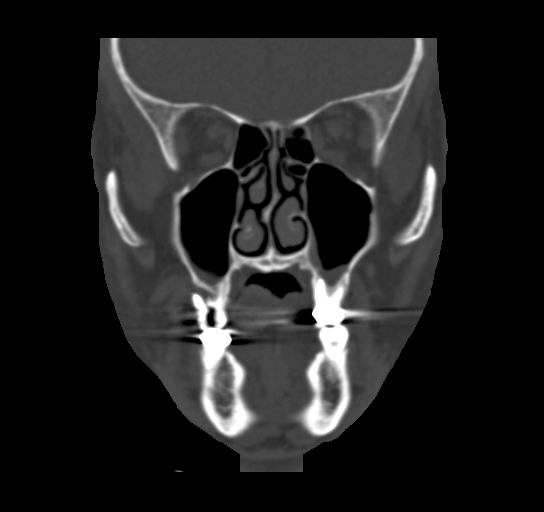
[im 41/73  bone]
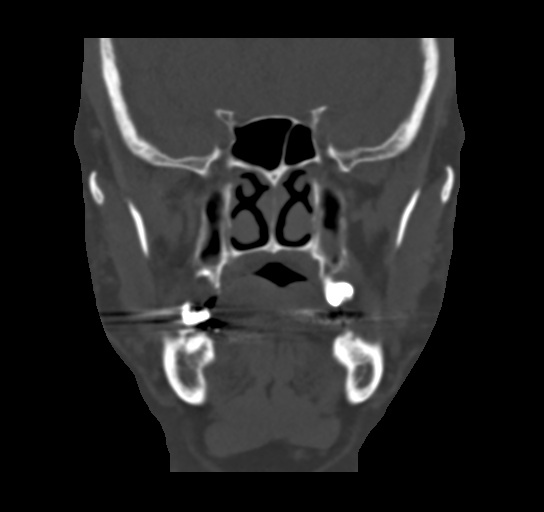

[Series 7: sagittal soft · sagittal · 0.36mm/px · 3 of 96 slices shown]
[im 32/96  bone]
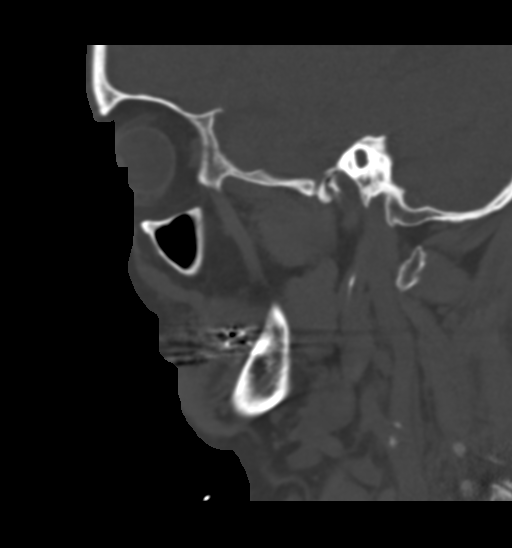
[im 48/96  bone]
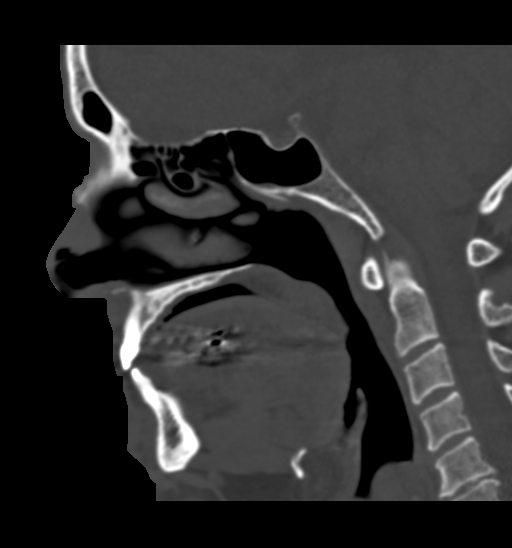
[im 64/96  bone]
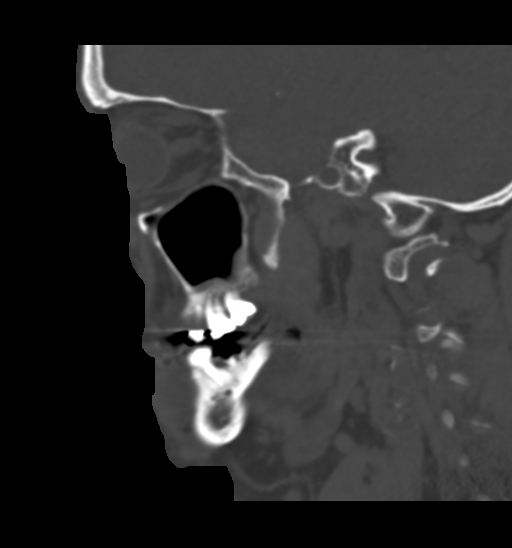

[15 of 47 positions shown; findings below may reference images not displayed]

FINDINGS: Brain: No evidence of acute territorial infarction, hemorrhage,
hydrocephalus,extra-axial collection or mass lesion/mass effect.
There is dilatation the ventricles and sulci consistent with
age-related atrophy. Low-attenuation changes in the deep white
matter consistent with small vessel ischemia.

Vascular: No hyperdense vessel or unexpected calcification.

Skull: The skull is intact.  No skull fracture seen.

Sinuses/Orbits: The visualized paranasal sinuses and mastoid air
cells are clear. The orbits and globes intact.

Other: Left-sided periorbital soft tissue swelling and nasal bridge
swelling is noted. A small preseptal soft tissue hematoma is seen.

Face:

Osseous: There is a nondisplaced left nasal bone fracture. No other
acute fracture is identified.

Orbits: No fracture identified. Unremarkable appearance of globes
and orbits.

Sinuses: The visualized paranasal sinuses and mastoid air cells are
unremarkable.

Soft tissues: Left periorbital and nasal bridge soft tissue swelling
is seen. There is a small preseptal hematoma present.

Limited intracranial: No acute findings.

Cervical spine:

Alignment: There is straightening of the normal cervical lordosis.

Skull base and vertebrae: Visualized skull base is intact. No
atlanto-occipital dissociation. The vertebral body heights are well
maintained. No fracture or pathologic osseous lesion seen.

Soft tissues and spinal canal: The visualized paraspinal soft
tissues are unremarkable. No prevertebral soft tissue swelling is
seen. The spinal canal is grossly unremarkable, no large epidural
collection or significant canal narrowing.

Disc levels: Cervical spine spondylosis is seen with mild disc
height loss disc osteophyte complex and uncovertebral osteophytes
most notable at C5-C6 with moderate neural foraminal narrowing.

Upper chest: The lung apices are clear. Thoracic inlet is within
normal limits.

Other: Carotid artery calcifications are seen.
IMPRESSION: No acute intracranial abnormality.

Findings consistent with mild age related atrophy and chronic small
vessel ischemia

Nondisplaced left nasal bone fracture with overlying soft tissue
swelling and a preseptal hematoma.

Left periorbital soft tissue swelling

No acute fracture or malalignment of the spine.

## 2022-11-11 ENCOUNTER — Ambulatory Visit: Payer: PPO

## 2022-11-11 DIAGNOSIS — I10 Essential (primary) hypertension: Secondary | ICD-10-CM | POA: Diagnosis not present

## 2022-11-21 ENCOUNTER — Telehealth: Payer: Self-pay

## 2022-11-21 NOTE — Telephone Encounter (Signed)
Patient calling for echo results 

## 2022-11-21 NOTE — Telephone Encounter (Signed)
Left a voicemail. Okay to proceed with upcoming surgery with low cardiac risk. Repeat echocardiogram in 05/2024 then f/u for mild AS.  Regards, Dr. Rosemary Holms

## 2022-12-04 DIAGNOSIS — M25562 Pain in left knee: Secondary | ICD-10-CM | POA: Diagnosis not present

## 2022-12-04 DIAGNOSIS — Z01812 Encounter for preprocedural laboratory examination: Secondary | ICD-10-CM | POA: Diagnosis not present

## 2022-12-04 DIAGNOSIS — M1712 Unilateral primary osteoarthritis, left knee: Secondary | ICD-10-CM | POA: Diagnosis not present

## 2022-12-19 DIAGNOSIS — M1712 Unilateral primary osteoarthritis, left knee: Secondary | ICD-10-CM | POA: Diagnosis not present

## 2022-12-19 DIAGNOSIS — G8918 Other acute postprocedural pain: Secondary | ICD-10-CM | POA: Diagnosis not present

## 2022-12-19 HISTORY — PX: REPLACEMENT TOTAL KNEE: SUR1224

## 2022-12-23 DIAGNOSIS — M1712 Unilateral primary osteoarthritis, left knee: Secondary | ICD-10-CM | POA: Diagnosis not present

## 2022-12-23 DIAGNOSIS — R262 Difficulty in walking, not elsewhere classified: Secondary | ICD-10-CM | POA: Diagnosis not present

## 2022-12-23 DIAGNOSIS — M25662 Stiffness of left knee, not elsewhere classified: Secondary | ICD-10-CM | POA: Diagnosis not present

## 2022-12-23 DIAGNOSIS — M6281 Muscle weakness (generalized): Secondary | ICD-10-CM | POA: Diagnosis not present

## 2022-12-25 DIAGNOSIS — M25662 Stiffness of left knee, not elsewhere classified: Secondary | ICD-10-CM | POA: Diagnosis not present

## 2022-12-25 DIAGNOSIS — M6281 Muscle weakness (generalized): Secondary | ICD-10-CM | POA: Diagnosis not present

## 2022-12-25 DIAGNOSIS — M1712 Unilateral primary osteoarthritis, left knee: Secondary | ICD-10-CM | POA: Diagnosis not present

## 2022-12-25 DIAGNOSIS — R262 Difficulty in walking, not elsewhere classified: Secondary | ICD-10-CM | POA: Diagnosis not present

## 2022-12-30 DIAGNOSIS — M1712 Unilateral primary osteoarthritis, left knee: Secondary | ICD-10-CM | POA: Diagnosis not present

## 2022-12-30 DIAGNOSIS — R262 Difficulty in walking, not elsewhere classified: Secondary | ICD-10-CM | POA: Diagnosis not present

## 2022-12-30 DIAGNOSIS — M25662 Stiffness of left knee, not elsewhere classified: Secondary | ICD-10-CM | POA: Diagnosis not present

## 2022-12-30 DIAGNOSIS — M6281 Muscle weakness (generalized): Secondary | ICD-10-CM | POA: Diagnosis not present

## 2023-01-01 DIAGNOSIS — M6281 Muscle weakness (generalized): Secondary | ICD-10-CM | POA: Diagnosis not present

## 2023-01-01 DIAGNOSIS — M1712 Unilateral primary osteoarthritis, left knee: Secondary | ICD-10-CM | POA: Diagnosis not present

## 2023-01-01 DIAGNOSIS — M25662 Stiffness of left knee, not elsewhere classified: Secondary | ICD-10-CM | POA: Diagnosis not present

## 2023-01-01 DIAGNOSIS — R262 Difficulty in walking, not elsewhere classified: Secondary | ICD-10-CM | POA: Diagnosis not present

## 2023-01-07 DIAGNOSIS — M25662 Stiffness of left knee, not elsewhere classified: Secondary | ICD-10-CM | POA: Diagnosis not present

## 2023-01-07 DIAGNOSIS — M1712 Unilateral primary osteoarthritis, left knee: Secondary | ICD-10-CM | POA: Diagnosis not present

## 2023-01-07 DIAGNOSIS — R262 Difficulty in walking, not elsewhere classified: Secondary | ICD-10-CM | POA: Diagnosis not present

## 2023-01-07 DIAGNOSIS — M6281 Muscle weakness (generalized): Secondary | ICD-10-CM | POA: Diagnosis not present

## 2023-01-09 DIAGNOSIS — M6281 Muscle weakness (generalized): Secondary | ICD-10-CM | POA: Diagnosis not present

## 2023-01-09 DIAGNOSIS — R262 Difficulty in walking, not elsewhere classified: Secondary | ICD-10-CM | POA: Diagnosis not present

## 2023-01-09 DIAGNOSIS — M25662 Stiffness of left knee, not elsewhere classified: Secondary | ICD-10-CM | POA: Diagnosis not present

## 2023-01-09 DIAGNOSIS — M1712 Unilateral primary osteoarthritis, left knee: Secondary | ICD-10-CM | POA: Diagnosis not present

## 2023-01-13 DIAGNOSIS — R262 Difficulty in walking, not elsewhere classified: Secondary | ICD-10-CM | POA: Diagnosis not present

## 2023-01-13 DIAGNOSIS — M6281 Muscle weakness (generalized): Secondary | ICD-10-CM | POA: Diagnosis not present

## 2023-01-13 DIAGNOSIS — M1712 Unilateral primary osteoarthritis, left knee: Secondary | ICD-10-CM | POA: Diagnosis not present

## 2023-01-13 DIAGNOSIS — M25662 Stiffness of left knee, not elsewhere classified: Secondary | ICD-10-CM | POA: Diagnosis not present

## 2023-01-15 DIAGNOSIS — M1712 Unilateral primary osteoarthritis, left knee: Secondary | ICD-10-CM | POA: Diagnosis not present

## 2023-01-15 DIAGNOSIS — M6281 Muscle weakness (generalized): Secondary | ICD-10-CM | POA: Diagnosis not present

## 2023-01-15 DIAGNOSIS — M25662 Stiffness of left knee, not elsewhere classified: Secondary | ICD-10-CM | POA: Diagnosis not present

## 2023-01-15 DIAGNOSIS — R262 Difficulty in walking, not elsewhere classified: Secondary | ICD-10-CM | POA: Diagnosis not present

## 2023-01-20 DIAGNOSIS — M25662 Stiffness of left knee, not elsewhere classified: Secondary | ICD-10-CM | POA: Diagnosis not present

## 2023-01-20 DIAGNOSIS — R262 Difficulty in walking, not elsewhere classified: Secondary | ICD-10-CM | POA: Diagnosis not present

## 2023-01-20 DIAGNOSIS — M1712 Unilateral primary osteoarthritis, left knee: Secondary | ICD-10-CM | POA: Diagnosis not present

## 2023-01-20 DIAGNOSIS — M6281 Muscle weakness (generalized): Secondary | ICD-10-CM | POA: Diagnosis not present

## 2023-01-22 DIAGNOSIS — R262 Difficulty in walking, not elsewhere classified: Secondary | ICD-10-CM | POA: Diagnosis not present

## 2023-01-22 DIAGNOSIS — M1712 Unilateral primary osteoarthritis, left knee: Secondary | ICD-10-CM | POA: Diagnosis not present

## 2023-01-22 DIAGNOSIS — M25662 Stiffness of left knee, not elsewhere classified: Secondary | ICD-10-CM | POA: Diagnosis not present

## 2023-01-22 DIAGNOSIS — M6281 Muscle weakness (generalized): Secondary | ICD-10-CM | POA: Diagnosis not present

## 2023-01-26 DIAGNOSIS — M25662 Stiffness of left knee, not elsewhere classified: Secondary | ICD-10-CM | POA: Diagnosis not present

## 2023-01-26 DIAGNOSIS — M1712 Unilateral primary osteoarthritis, left knee: Secondary | ICD-10-CM | POA: Diagnosis not present

## 2023-01-26 DIAGNOSIS — M6281 Muscle weakness (generalized): Secondary | ICD-10-CM | POA: Diagnosis not present

## 2023-01-26 DIAGNOSIS — R262 Difficulty in walking, not elsewhere classified: Secondary | ICD-10-CM | POA: Diagnosis not present

## 2023-01-28 ENCOUNTER — Other Ambulatory Visit: Payer: Self-pay | Admitting: Family Medicine

## 2023-01-28 DIAGNOSIS — I10 Essential (primary) hypertension: Secondary | ICD-10-CM

## 2023-01-29 DIAGNOSIS — M6281 Muscle weakness (generalized): Secondary | ICD-10-CM | POA: Diagnosis not present

## 2023-01-29 DIAGNOSIS — M1712 Unilateral primary osteoarthritis, left knee: Secondary | ICD-10-CM | POA: Diagnosis not present

## 2023-01-29 DIAGNOSIS — M25662 Stiffness of left knee, not elsewhere classified: Secondary | ICD-10-CM | POA: Diagnosis not present

## 2023-01-29 DIAGNOSIS — R262 Difficulty in walking, not elsewhere classified: Secondary | ICD-10-CM | POA: Diagnosis not present

## 2023-02-04 DIAGNOSIS — M7521 Bicipital tendinitis, right shoulder: Secondary | ICD-10-CM | POA: Diagnosis not present

## 2023-02-26 ENCOUNTER — Telehealth: Payer: Self-pay | Admitting: Family Medicine

## 2023-02-26 DIAGNOSIS — N529 Male erectile dysfunction, unspecified: Secondary | ICD-10-CM

## 2023-02-26 MED ORDER — SILDENAFIL CITRATE 20 MG PO TABS
20.0000 mg | ORAL_TABLET | Freq: Every day | ORAL | 1 refills | Status: DC | PRN
Start: 2023-02-26 — End: 2023-07-15

## 2023-02-26 NOTE — Telephone Encounter (Signed)
Encourage patient to contact the pharmacy for refills or they can request refills through [facility]    WHAT PHARMACY WOULD THEY LIKE THIS SENT TO:  [id number] - [place], [place] - [address] AT [facility] [address]   MEDICATION NAME & DOSE: sildenafil (REVATIO) 20 MG tablet   NOTES/COMMENTS FROM PATIENT:      Front office please notify patient: It takes 48-72 hours to process rx refill requests Ask patient to call pharmacy to ensure rx is ready before heading there.

## 2023-02-26 NOTE — Telephone Encounter (Signed)
Sent Rx to requested pharmacy, pt has been made aware

## 2023-02-27 ENCOUNTER — Encounter: Payer: PPO | Admitting: Family Medicine

## 2023-03-09 ENCOUNTER — Other Ambulatory Visit: Payer: Self-pay | Admitting: Family Medicine

## 2023-03-09 DIAGNOSIS — I10 Essential (primary) hypertension: Secondary | ICD-10-CM

## 2023-04-30 ENCOUNTER — Encounter: Payer: PPO | Admitting: Family Medicine

## 2023-05-20 ENCOUNTER — Encounter: Payer: PPO | Admitting: Family Medicine

## 2023-05-25 ENCOUNTER — Encounter: Payer: PPO | Admitting: Family Medicine

## 2023-06-02 DIAGNOSIS — M7521 Bicipital tendinitis, right shoulder: Secondary | ICD-10-CM | POA: Diagnosis not present

## 2023-06-11 ENCOUNTER — Encounter: Payer: PPO | Admitting: Family Medicine

## 2023-06-25 ENCOUNTER — Encounter: Payer: PPO | Admitting: Family Medicine

## 2023-07-15 ENCOUNTER — Encounter: Payer: Self-pay | Admitting: Family Medicine

## 2023-07-15 ENCOUNTER — Ambulatory Visit (INDEPENDENT_AMBULATORY_CARE_PROVIDER_SITE_OTHER): Payer: PPO | Admitting: Family Medicine

## 2023-07-15 VITALS — BP 132/78 | HR 58 | Temp 99.0°F | Ht 71.0 in | Wt 226.6 lb

## 2023-07-15 DIAGNOSIS — Z0000 Encounter for general adult medical examination without abnormal findings: Secondary | ICD-10-CM

## 2023-07-15 DIAGNOSIS — Z131 Encounter for screening for diabetes mellitus: Secondary | ICD-10-CM

## 2023-07-15 DIAGNOSIS — I10 Essential (primary) hypertension: Secondary | ICD-10-CM

## 2023-07-15 DIAGNOSIS — Z125 Encounter for screening for malignant neoplasm of prostate: Secondary | ICD-10-CM | POA: Diagnosis not present

## 2023-07-15 DIAGNOSIS — E785 Hyperlipidemia, unspecified: Secondary | ICD-10-CM | POA: Diagnosis not present

## 2023-07-15 DIAGNOSIS — N529 Male erectile dysfunction, unspecified: Secondary | ICD-10-CM

## 2023-07-15 MED ORDER — SILDENAFIL CITRATE 20 MG PO TABS: 20.0000 mg | ORAL_TABLET | Freq: Every day | ORAL | 1 refills | Status: AC | PRN

## 2023-07-15 MED ORDER — LISINOPRIL-HYDROCHLOROTHIAZIDE 20-25 MG PO TABS
1.0000 | ORAL_TABLET | Freq: Every day | ORAL | 1 refills | Status: DC
Start: 2023-07-15 — End: 2024-01-25

## 2023-07-15 MED ORDER — AMLODIPINE BESYLATE 5 MG PO TABS
ORAL_TABLET | ORAL | 1 refills | Status: DC
Start: 2023-07-15 — End: 2024-01-25

## 2023-07-15 NOTE — Progress Notes (Signed)
 Subjective:  Patient ID: Brian Mcguire, male    DOB: November 29, 1950  Age: 73 y.o. MRN: [id number]  CC:  Chief Complaint  Patient presents with   Annual Exam    Pt is doing well, no concerns today     HPI Brian Mcguire presents for Annual Exam  PCP, me Ortho, Dr. Blanchie Dessert, left knee arthritis -  surgery last year. Doing well from knee surgery. Also treated by sports medicine Dr. Obie Dredge, right shoulder biceps tendinitis.Plan for MRI shoulder this Friday. Dr. Everardo Pacific for left rotator cuff surgery.  Ophthalmology, Dr. Fabian Sharp, cataract  Cardiology, Dr. Rosemary Holms, preop eval in June of last year, history of hypertension.  EKG with poor R wave progression, PAC, first-degree AV block in June of last year.  Nonspecific abnormalities at baseline with occasional PVC.  Echocardiogram ordered then proceeded with knee surgery with low cardiac risk.  No changes in hypertension at his June 2024 visit.  July 2024 echo, mild AS, no other significant change noted from previous echo in 2019.  Normal global wall motion, normal systolic function with EF of 53%, grade 2 diastolic dysfunction.  Mild aortic stenosis.  Hypertension: No new side effects with meds. No chest pains.  Amlodipine 5mg  every day. No ankle swelling.  Lisinopril hct 20/25mg  every day.  Home readings:none  Occasional use of revatio -20-30mg .  No HA/flushing typically, no vision/hearing changes, no CP with exertion or intercourse. Effective.  BP Readings from Last 3 Encounters:  07/15/23 132/78  10/13/22 (!) 154/91  10/02/22 132/80   Lab Results  Component Value Date   CREATININE 1.11 08/25/2022   Elevated LDL: No current statin.  The 10-year ASCVD risk score (Arnett DK, et al., 2019) is: 27.2%   Values used to calculate the score:     Age: 48 years     Sex: Male     Is Non-Hispanic African American: No     Diabetic: No     Tobacco smoker: No     Systolic Blood Pressure: 132 mmHg     Is BP treated: Yes     HDL  Cholesterol: 35.4 mg/dL     Total Cholesterol: 182 mg/dL  Lab Results  Component Value Date   CHOL 182 08/25/2022   HDL 35.40 (L) 08/25/2022   LDLCALC 130 (H) 08/25/2022   TRIG 85.0 08/25/2022   CHOLHDL 5 08/25/2022   Lab Results  Component Value Date   ALT 15 08/25/2022   AST 20 08/25/2022   ALKPHOS 43 08/25/2022   BILITOT 1.0 08/25/2022       07/15/2023    4:19 PM 08/25/2022   10:39 AM 08/13/2022    2:15 PM 01/15/2022   10:58 AM 07/11/2021    8:04 AM  Depression screen PHQ 2/9  Decreased Interest 0 0 0 0 0  Down, Depressed, Hopeless 0 0 0 0 0  PHQ - 2 Score 0 0 0 0 0  Altered sleeping 0 0 0 0   Tired, decreased energy 0 0 0 0   Change in appetite 0 0 0 0   Feeling bad or failure about yourself  0 0 0 0   Trouble concentrating 0 0 0 0   Moving slowly or fidgety/restless 0 0 0 0   Suicidal thoughts 0 0 0 0   PHQ-9 Score 0 0 0 0   Difficult doing work/chores  Somewhat difficult Not difficult at all      Health Maintenance  Topic Date Due   COVID-19  Vaccine (1) Never done   Zoster Vaccines- Shingrix (1 of 2) Never done   Medicare Annual Wellness (AWV)  08/13/2023   INFLUENZA VACCINE  08/03/2023 (Originally 12/04/2022)   Pneumonia Vaccine 46+ Years old (2 of 2 - PPSV23 or PCV20) 08/13/2023 (Originally 12/02/2016)   DTaP/Tdap/Td (2 - Td or Tdap) 07/04/2030   Colonoscopy  05/08/2032   Hepatitis C Screening  Completed   HPV VACCINES  Aged Out  Colonoscopy in past few years. Dr. Elnoria Howard? Prostate: The natural history of prostate cancer and ongoing controversy regarding screening and potential treatment outcomes of prostate cancer has been discussed with the patient. The meaning of a false positive PSA and a false negative PSA has been discussed. He indicates understanding of the limitations of this screening test and wishes to proceed with screening PSA testing. Lab Results  Component Value Date   PSA1 0.3 09/29/2019   PSA1 0.8 07/12/2018   PSA 0.27 06/06/2021   PSA 0.28  12/03/2015   Dermatology visit in few weeks - for checkup. Basal cell CA,    Immunization History  Administered Date(s) Administered   Pneumococcal Conjugate-13 12/03/2015   Tdap 07/03/2020  Declines flu, pneumonia, and shingles vaccines as well as covid vaccines.   No results found. Followed by Dr. Dione Booze as above.   Dental: prior dentist - now finding a new one - insurance issue.  Alcohol: past 6 months -  16-20 drinks per week over past 6 months. Decide to cut back over past 6 weeks, only few glasses wine total. Declines withdrawal symptoms or need for assistance  Tobacco: none  Exercise: trying to lose weight  - more walking, cut back on alcohol, some golf.  Wt Readings from Last 3 Encounters:  07/15/23 226 lb 9.6 oz (102.8 kg)  10/13/22 245 lb (111.1 kg)  10/02/22 245 lb 3.2 oz (111.2 kg)      History Patient Active Problem List   Diagnosis Date Noted   Preop cardiovascular exam 10/13/2022   Essential hypertension 08/25/2022   Hyperlipidemia 08/25/2022   Erectile dysfunction 08/25/2022   Gastroesophageal reflux disease 08/25/2022   Substance dependency (HCC) 12/03/2015   Past Medical History:  Diagnosis Date   Hypertension    Past Surgical History:  Procedure Laterality Date   CHOLECYSTECTOMY     COLON SURGERY     REPLACEMENT TOTAL KNEE Left 12/19/2022   ROTATOR CUFF REPAIR Left 06/17/2021   No Known Allergies Prior to Admission medications   Medication Sig Start Date End Date Taking? Authorizing Provider  amLODipine (NORVASC) 5 MG tablet TAKE 1 TABLET(5 MG) BY MOUTH DAILY 03/09/23  Yes Shade Flood, MD  lisinopril-hydrochlorothiazide (ZESTORETIC) 20-25 MG tablet TAKE 1 TABLET BY MOUTH DAILY 01/28/23  Yes Shade Flood, MD  sildenafil (REVATIO) 20 MG tablet Take 1 tablet (20 mg total) by mouth daily as needed. 02/26/23  Yes Shade Flood, MD  ketorolac (ACULAR) 0.5 % ophthalmic solution Place 1 drop into both eyes 4 (four) times daily. Patient  not taking: Reported on 07/15/2023 08/25/22   [provider]  ofloxacin (OCUFLOX) 0.3 % ophthalmic solution Place 1 drop into both eyes. Patient not taking: Reported on 07/15/2023 08/25/22   [provider]  prednisoLONE acetate (PRED FORTE) 1 % ophthalmic suspension Place 1 drop into both eyes every 2 (two) hours while awake. Patient not taking: Reported on 07/15/2023 08/25/22   [provider]   Social History   Socioeconomic History   Marital status: Divorced    Spouse  name: Not on file   Number of children: 2   Years of education: Not on file   Highest education level: Some college, no degree  Occupational History   Not on file  Tobacco Use   Smoking status: Never   Smokeless tobacco: Never  Vaping Use   Vaping status: Never Used  Substance and Sexual Activity   Alcohol use: Yes    Alcohol/week: 3.0 standard drinks of alcohol    Types: 1 Glasses of wine, 1 Cans of beer, 1 Shots of liquor per week    Comment: pt notes 3-4 drinks a week   Drug use: No   Sexual activity: Never  Other Topics Concern   Not on file  Social History Narrative   Not on file   Social Drivers of Health   Financial Resource Strain: Low Risk  (08/13/2022)   Overall Financial Resource Strain (CARDIA)    Difficulty of Paying Living Expenses: Not hard at all  Food Insecurity: No Food Insecurity (08/13/2022)   Hunger Vital Sign    Worried About Running Out of Food in the Last Year: Never true    Ran Out of Food in the Last Year: Never true  Transportation Needs: No Transportation Needs (08/13/2022)   PRAPARE - Administrator, Civil Service (Medical): No    Lack of Transportation (Non-Medical): No  Physical Activity: Insufficiently Active (08/13/2022)   Exercise Vital Sign    Days of Exercise per Week: 3 days    Minutes of Exercise per Session: 30 min  Stress: No Stress Concern Present (08/13/2022)   [employer] of Occupational Health - Occupational Stress  Questionnaire    Feeling of Stress : Not at all  Social Connections: Moderately Integrated (08/13/2022)   Social Connection and Isolation Panel [NHANES]    Frequency of Communication with Friends and Family: More than three times a week    Frequency of Social Gatherings with Friends and Family: More than three times a week    Attends Religious Services: Never    Database administrator or Organizations: Yes    Attends Engineer, structural: More than 4 times per year    Marital Status: Living with partner  Intimate Partner Violence: Not At Risk (08/13/2022)   Humiliation, Afraid, Rape, and Kick questionnaire    Fear of Current or Ex-Partner: No    Emotionally Abused: No    Physically Abused: No    Sexually Abused: No    Review of Systems 13 point review of systems per patient health survey noted.  Negative other than as indicated above or in HPI. Some tinnitus - intermittent - stable. Hearing test in past with option of hearing aids - he decided against hearing aids.    Objective:   Vitals:   07/15/23 1615  BP: 132/78  Pulse: (!) 58  Temp: 99 F (37.2 C)  TempSrc: Temporal  SpO2: 98%  Weight: 226 lb 9.6 oz (102.8 kg)  Height: 5\' 11"  (1.803 m)     Physical Exam Vitals reviewed.  Constitutional:      Appearance: He is well-developed.  HENT:     Head: Normocephalic and atraumatic.     Right Ear: External ear normal.     Left Ear: External ear normal.  Eyes:     Conjunctiva/sclera: Conjunctivae normal.     Pupils: Pupils are equal, round, and reactive to light.  Neck:     Thyroid: No thyromegaly.  Cardiovascular:     Rate and  Rhythm: Normal rate and regular rhythm.     Heart sounds: Normal heart sounds.  Pulmonary:     Effort: Pulmonary effort is normal. No respiratory distress.     Breath sounds: Normal breath sounds. No wheezing.  Abdominal:     General: There is no distension.     Palpations: Abdomen is soft.     Tenderness: There is no abdominal  tenderness.  Musculoskeletal:        General: No tenderness. Normal range of motion.     Cervical back: Normal range of motion and neck supple.  Lymphadenopathy:     Cervical: No cervical adenopathy.  Skin:    General: Skin is warm and dry.  Neurological:     Mental Status: He is alert and oriented to person, place, and time.     Deep Tendon Reflexes: Reflexes are normal and symmetric.  Psychiatric:        Behavior: Behavior normal.        Assessment & Plan:  Brian Mcguire is a 73 y.o. male . Annual physical exam - Plan: Comprehensive metabolic panel, Lipid panel, CBC with Differential/Platelet, PSA, Medicare, CANCELED: Hemoglobin A1c  - -anticipatory guidance as below in AVS, screening labs above. Health maintenance items as above in HPI discussed/recommended as applicable.   Erectile dysfunction, unspecified erectile dysfunction type - Plan: sildenafil (REVATIO) 20 MG tablet  -Sildenafil Rx given - use lowest effective dose. Side effects discussed (including but not limited to headache/flushing, blue discoloration of vision, possible vascular steal and risk of cardiac effects if underlying unknown coronary artery disease, and permanent sensorineural hearing loss). Understanding expressed.  Essential hypertension - Plan: CBC with Differential/Platelet, amLODipine (NORVASC) 5 MG tablet, lisinopril-hydrochlorothiazide (ZESTORETIC) 20-25 MG tablet  -Tolerating current med regimen, stable.  Check labs as above and adjust plan accordingly.  Hyperlipidemia, unspecified hyperlipidemia type - Plan: Comprehensive metabolic panel, Lipid panel  -Elevated ASCVD risk score, age likely contributor with elevated LDL.  Option of coronary calcium scoring depending on lab results to help decide on statin.  Echo last year overall reassuring.    -Commended on weight loss and decreased alcohol use.  Anticipate improved readings.  Screening for diabetes mellitus - Plan: Comprehensive metabolic panel,  CANCELED: Hemoglobin A1c  Screening for prostate cancer - Plan: PSA, Medicare   Meds ordered this encounter  Medications   sildenafil (REVATIO) 20 MG tablet    Sig: Take 1 tablet (20 mg total) by mouth daily as needed.    Dispense:  45 tablet    Refill:  1   amLODipine (NORVASC) 5 MG tablet    Sig: TAKE 1 TABLET(5 MG) BY MOUTH DAILY    Dispense:  90 tablet    Refill:  1   lisinopril-hydrochlorothiazide (ZESTORETIC) 20-25 MG tablet    Sig: Take 1 tablet by mouth daily.    Dispense:  90 tablet    Refill:  1   Patient Instructions  Keep follow up with ortho as planned.  I will check labs today and depending on the LDL level, would consider coronary calcium scoring to help decide on cholesterol treatment if needed.  Great work on cutting back on alcohol, walking and weight loss. If you notice lower blood pressure readings at home, follow up and we can discuss changes if needed.    Take care!    Preventive Care 65 Years and Older, Male Preventive care refers to lifestyle choices and visits with your health care provider that can promote health and wellness. Preventive  care visits are also called wellness exams. What can I expect for my preventive care visit? Counseling During your preventive care visit, your health care provider may ask about your: Medical history, including: Past medical problems. Family medical history. History of falls. Current health, including: Emotional well-being. Home life and relationship well-being. Sexual activity. Memory and ability to understand (cognition). Lifestyle, including: Alcohol, nicotine or tobacco, and drug use. Access to firearms. Diet, exercise, and sleep habits. Work and work Astronomer. Sunscreen use. Safety issues such as seatbelt and bike helmet use. Physical exam Your health care provider will check your: Height and weight. These may be used to calculate your BMI (body mass index). BMI is a measurement that tells if you  are at a healthy weight. Waist circumference. This measures the distance around your waistline. This measurement also tells if you are at a healthy weight and may help predict your risk of certain diseases, such as type 2 diabetes and high blood pressure. Heart rate and blood pressure. Body temperature. Skin for abnormal spots. What immunizations do I need?  Vaccines are usually given at various ages, according to a schedule. Your health care provider will recommend vaccines for you based on your age, medical history, and lifestyle or other factors, such as travel or where you work. What tests do I need? Screening Your health care provider may recommend screening tests for certain conditions. This may include: Lipid and cholesterol levels. Diabetes screening. This is done by checking your blood sugar (glucose) after you have not eaten for a while (fasting). Hepatitis C test. Hepatitis B test. HIV (human immunodeficiency virus) test. STI (sexually transmitted infection) testing, if you are at risk. Lung cancer screening. Colorectal cancer screening. Prostate cancer screening. Abdominal aortic aneurysm (AAA) screening. You may need this if you are a current or former smoker. Talk with your health care provider about your test results, treatment options, and if necessary, the need for more tests. Follow these instructions at home: Eating and drinking  Eat a diet that includes fresh fruits and vegetables, whole grains, lean protein, and low-fat dairy products. Limit your intake of foods with high amounts of sugar, saturated fats, and salt. Take vitamin and mineral supplements as recommended by your health care provider. Do not drink alcohol if your health care provider tells you not to drink. If you drink alcohol: Limit how much you have to 0-2 drinks a day. Know how much alcohol is in your drink. In the [place] one drink equals one 12 oz bottle of beer (355 mL), one 5 oz glass of wine (148  mL), or one 1 oz glass of hard liquor (44 mL). Lifestyle Brush your teeth every morning and night with fluoride toothpaste. Floss one time each day. Exercise for at least 30 minutes 5 or more days each week. Do not use any products that contain nicotine or tobacco. These products include cigarettes, chewing tobacco, and vaping devices, such as e-cigarettes. If you need help quitting, ask your health care provider. Do not use drugs. If you are sexually active, practice safe sex. Use a condom or other form of protection to prevent STIs. Take aspirin only as told by your health care provider. Make sure that you understand how much to take and what form to take. Work with your health care provider to find out whether it is safe and beneficial for you to take aspirin daily. Ask your health care provider if you need to take a cholesterol-lowering medicine (statin). Find healthy ways to  manage stress, such as: Meditation, yoga, or listening to music. Journaling. Talking to a trusted person. Spending time with friends and family. Safety Always wear your seat belt while driving or riding in a vehicle. Do not drive: If you have been drinking alcohol. Do not ride with someone who has been drinking. When you are tired or distracted. While texting. If you have been using any mind-altering substances or drugs. Wear a helmet and other protective equipment during sports activities. If you have firearms in your house, make sure you follow all gun safety procedures. Minimize exposure to UV radiation to reduce your risk of skin cancer. What's next? Visit your health care provider once a year for an annual wellness visit. Ask your health care provider how often you should have your eyes and teeth checked. Stay up to date on all vaccines. This information is not intended to replace advice given to you by your health care provider. Make sure you discuss any questions you have with your health care  provider. Document Revised: 10/17/2020 Document Reviewed: 10/17/2020 Elsevier Patient Education  2024 Elsevier Inc.    Signed,   Meredith Staggers, MD [facility], [facility] 07/15/23 5:24 PM

## 2023-07-15 NOTE — Patient Instructions (Addendum)
 Keep follow up with ortho as planned.  I will check labs today and depending on the LDL level, would consider coronary calcium scoring to help decide on cholesterol treatment if needed.  Great work on cutting back on alcohol, walking and weight loss. If you notice lower blood pressure readings at home, follow up and we can discuss changes if needed.    Take care!    Preventive Care 70 Years and Older, Male Preventive care refers to lifestyle choices and visits with your health care provider that can promote health and wellness. Preventive care visits are also called wellness exams. What can I expect for my preventive care visit? Counseling During your preventive care visit, your health care provider may ask about your: Medical history, including: Past medical problems. Family medical history. History of falls. Current health, including: Emotional well-being. Home life and relationship well-being. Sexual activity. Memory and ability to understand (cognition). Lifestyle, including: Alcohol, nicotine or tobacco, and drug use. Access to firearms. Diet, exercise, and sleep habits. Work and work Astronomer. Sunscreen use. Safety issues such as seatbelt and bike helmet use. Physical exam Your health care provider will check your: Height and weight. These may be used to calculate your BMI (body mass index). BMI is a measurement that tells if you are at a healthy weight. Waist circumference. This measures the distance around your waistline. This measurement also tells if you are at a healthy weight and may help predict your risk of certain diseases, such as type 2 diabetes and high blood pressure. Heart rate and blood pressure. Body temperature. Skin for abnormal spots. What immunizations do I need?  Vaccines are usually given at various ages, according to a schedule. Your health care provider will recommend vaccines for you based on your age, medical history, and lifestyle or other  factors, such as travel or where you work. What tests do I need? Screening Your health care provider may recommend screening tests for certain conditions. This may include: Lipid and cholesterol levels. Diabetes screening. This is done by checking your blood sugar (glucose) after you have not eaten for a while (fasting). Hepatitis C test. Hepatitis B test. HIV (human immunodeficiency virus) test. STI (sexually transmitted infection) testing, if you are at risk. Lung cancer screening. Colorectal cancer screening. Prostate cancer screening. Abdominal aortic aneurysm (AAA) screening. You may need this if you are a current or former smoker. Talk with your health care provider about your test results, treatment options, and if necessary, the need for more tests. Follow these instructions at home: Eating and drinking  Eat a diet that includes fresh fruits and vegetables, whole grains, lean protein, and low-fat dairy products. Limit your intake of foods with high amounts of sugar, saturated fats, and salt. Take vitamin and mineral supplements as recommended by your health care provider. Do not drink alcohol if your health care provider tells you not to drink. If you drink alcohol: Limit how much you have to 0-2 drinks a day. Know how much alcohol is in your drink. In the [place] one drink equals one 12 oz bottle of beer (355 mL), one 5 oz glass of wine (148 mL), or one 1 oz glass of hard liquor (44 mL). Lifestyle Brush your teeth every morning and night with fluoride toothpaste. Floss one time each day. Exercise for at least 30 minutes 5 or more days each week. Do not use any products that contain nicotine or tobacco. These products include cigarettes, chewing tobacco, and vaping devices, such as e-cigarettes.  If you need help quitting, ask your health care provider. Do not use drugs. If you are sexually active, practice safe sex. Use a condom or other form of protection to prevent STIs. Take  aspirin only as told by your health care provider. Make sure that you understand how much to take and what form to take. Work with your health care provider to find out whether it is safe and beneficial for you to take aspirin daily. Ask your health care provider if you need to take a cholesterol-lowering medicine (statin). Find healthy ways to manage stress, such as: Meditation, yoga, or listening to music. Journaling. Talking to a trusted person. Spending time with friends and family. Safety Always wear your seat belt while driving or riding in a vehicle. Do not drive: If you have been drinking alcohol. Do not ride with someone who has been drinking. When you are tired or distracted. While texting. If you have been using any mind-altering substances or drugs. Wear a helmet and other protective equipment during sports activities. If you have firearms in your house, make sure you follow all gun safety procedures. Minimize exposure to UV radiation to reduce your risk of skin cancer. What's next? Visit your health care provider once a year for an annual wellness visit. Ask your health care provider how often you should have your eyes and teeth checked. Stay up to date on all vaccines. This information is not intended to replace advice given to you by your health care provider. Make sure you discuss any questions you have with your health care provider. Document Revised: 10/17/2020 Document Reviewed: 10/17/2020 Elsevier Patient Education  2024 [employer].

## 2023-07-16 LAB — CBC WITH DIFFERENTIAL/PLATELET
Basophils Absolute: 0.1 10*3/uL (ref 0.0–0.1)
Basophils Relative: 1.2 % (ref 0.0–3.0)
Eosinophils Absolute: 0 10*3/uL (ref 0.0–0.7)
Eosinophils Relative: 0.5 % (ref 0.0–5.0)
HCT: 42.5 % (ref 39.0–52.0)
Hemoglobin: 14.6 g/dL (ref 13.0–17.0)
Lymphocytes Relative: 14.3 % (ref 12.0–46.0)
Lymphs Abs: 0.7 10*3/uL (ref 0.7–4.0)
MCHC: 34.3 g/dL (ref 30.0–36.0)
MCV: 95.2 fl (ref 78.0–100.0)
Monocytes Absolute: 0.5 10*3/uL (ref 0.1–1.0)
Monocytes Relative: 11.1 % (ref 3.0–12.0)
Neutro Abs: 3.4 10*3/uL (ref 1.4–7.7)
Neutrophils Relative %: 72.9 % (ref 43.0–77.0)
Platelets: 203 10*3/uL (ref 150.0–400.0)
RBC: 4.46 Mil/uL (ref 4.22–5.81)
RDW: 12.9 % (ref 11.5–15.5)
WBC: 4.7 10*3/uL (ref 4.0–10.5)

## 2023-07-16 LAB — LIPID PANEL
Cholesterol: 173 mg/dL (ref 0–200)
HDL: 42.9 mg/dL (ref >39.00)
LDL Cholesterol: 116 mg/dL — ABNORMAL HIGH (ref 0–99)
NonHDL: 130.51
Total CHOL/HDL Ratio: 4
Triglycerides: 73 mg/dL (ref 0.0–149.0)
VLDL: 14.6 mg/dL (ref 0.0–40.0)

## 2023-07-16 LAB — PSA, MEDICARE: PSA: 0.27 ng/mL (ref 0.10–4.00)

## 2023-07-16 LAB — COMPREHENSIVE METABOLIC PANEL
ALT: 16 U/L (ref 0–53)
AST: 15 U/L (ref 0–37)
Albumin: 4.6 g/dL (ref 3.5–5.2)
Alkaline Phosphatase: 46 U/L (ref 39–117)
BUN: 16 mg/dL (ref 6–23)
CO2: 28 meq/L (ref 19–32)
Calcium: 9.8 mg/dL (ref 8.4–10.5)
Chloride: 98 meq/L (ref 96–112)
Creatinine, Ser: 0.97 mg/dL (ref 0.40–1.50)
GFR: 77.78 mL/min (ref >60.00)
Glucose, Bld: 88 mg/dL (ref 70–99)
Potassium: 3.8 meq/L (ref 3.5–5.1)
Sodium: 137 meq/L (ref 135–145)
Total Bilirubin: 1 mg/dL (ref 0.2–1.2)
Total Protein: 6.8 g/dL (ref 6.0–8.3)

## 2023-07-17 DIAGNOSIS — M25511 Pain in right shoulder: Secondary | ICD-10-CM | POA: Diagnosis not present

## 2023-07-20 ENCOUNTER — Telehealth: Payer: Self-pay

## 2023-07-20 NOTE — Telephone Encounter (Signed)
 Can not send labs through Email but will print and send by mail for patient.   Printed and sent on 07/20/2023

## 2023-07-20 NOTE — Telephone Encounter (Signed)
 Copied from CRM [id number]. Topic: Clinical - Lab/Test Results >> Jul 20, 2023  8:36 AM Adele Barthel wrote: Reason for CRM:   Patient is returning call from [facility] concerning lab results. Reviewed lab results with patient and he had no further questions. Requested the results be sent to email on file.

## 2023-07-20 NOTE — Telephone Encounter (Signed)
 Called patient to go over lab results, Left VM to return call.

## 2023-07-20 NOTE — Telephone Encounter (Signed)
-----   Message from Shade Flood sent at 07/19/2023  6:03 PM EDT ----- Call patient.  LDL cholesterol few points elevated.  I would consider the coronary calcium scoring test we discussed to help decide on medications.  Other labs looked okay including electrolytes, kidney, liver test, blood counts and prostate test.  Let me know if there are questions.

## 2023-08-13 DIAGNOSIS — M25511 Pain in right shoulder: Secondary | ICD-10-CM | POA: Diagnosis not present

## 2023-10-20 DIAGNOSIS — D485 Neoplasm of uncertain behavior of skin: Secondary | ICD-10-CM | POA: Diagnosis not present

## 2023-10-20 DIAGNOSIS — D1801 Hemangioma of skin and subcutaneous tissue: Secondary | ICD-10-CM | POA: Diagnosis not present

## 2023-10-20 DIAGNOSIS — L859 Epidermal thickening, unspecified: Secondary | ICD-10-CM | POA: Diagnosis not present

## 2023-10-20 DIAGNOSIS — L578 Other skin changes due to chronic exposure to nonionizing radiation: Secondary | ICD-10-CM | POA: Diagnosis not present

## 2023-10-20 DIAGNOSIS — D229 Melanocytic nevi, unspecified: Secondary | ICD-10-CM | POA: Diagnosis not present

## 2023-10-20 DIAGNOSIS — L570 Actinic keratosis: Secondary | ICD-10-CM | POA: Diagnosis not present

## 2023-10-20 DIAGNOSIS — L821 Other seborrheic keratosis: Secondary | ICD-10-CM | POA: Diagnosis not present

## 2023-10-20 DIAGNOSIS — L814 Other melanin hyperpigmentation: Secondary | ICD-10-CM | POA: Diagnosis not present

## 2023-10-20 DIAGNOSIS — Z85828 Personal history of other malignant neoplasm of skin: Secondary | ICD-10-CM | POA: Diagnosis not present

## 2024-01-23 ENCOUNTER — Other Ambulatory Visit: Payer: Self-pay | Admitting: Family Medicine

## 2024-01-23 DIAGNOSIS — I10 Essential (primary) hypertension: Secondary | ICD-10-CM

## 2024-02-16 DIAGNOSIS — H9202 Otalgia, left ear: Secondary | ICD-10-CM | POA: Diagnosis not present

## 2024-04-12 DIAGNOSIS — M6281 Muscle weakness (generalized): Secondary | ICD-10-CM | POA: Diagnosis not present

## 2024-04-12 DIAGNOSIS — R2689 Other abnormalities of gait and mobility: Secondary | ICD-10-CM | POA: Diagnosis not present

## 2024-04-12 DIAGNOSIS — H8192 Unspecified disorder of vestibular function, left ear: Secondary | ICD-10-CM | POA: Diagnosis not present

## 2024-05-20 ENCOUNTER — Other Ambulatory Visit: Payer: Self-pay | Admitting: Family Medicine

## 2024-05-20 DIAGNOSIS — I10 Essential (primary) hypertension: Secondary | ICD-10-CM

## 2024-05-26 ENCOUNTER — Other Ambulatory Visit: Payer: Self-pay | Admitting: Family Medicine

## 2024-05-26 DIAGNOSIS — I10 Essential (primary) hypertension: Secondary | ICD-10-CM
